# Patient Record
Sex: Female | Born: 1950 | Race: White | Hispanic: No | Marital: Single | Smoking: Never smoker
Health system: Southern US, Community
[De-identification: ages and names within clinical notes are randomized; demographics above are authoritative.]

## PROBLEM LIST (undated history)

## (undated) DIAGNOSIS — M1990 Unspecified osteoarthritis, unspecified site: Secondary | ICD-10-CM

## (undated) DIAGNOSIS — F32A Depression, unspecified: Secondary | ICD-10-CM

## (undated) DIAGNOSIS — K219 Gastro-esophageal reflux disease without esophagitis: Secondary | ICD-10-CM

## (undated) DIAGNOSIS — M51369 Other intervertebral disc degeneration, lumbar region without mention of lumbar back pain or lower extremity pain: Secondary | ICD-10-CM

## (undated) DIAGNOSIS — E039 Hypothyroidism, unspecified: Secondary | ICD-10-CM

## (undated) DIAGNOSIS — M5136 Other intervertebral disc degeneration, lumbar region: Secondary | ICD-10-CM

## (undated) DIAGNOSIS — D649 Anemia, unspecified: Secondary | ICD-10-CM

## (undated) DIAGNOSIS — F329 Major depressive disorder, single episode, unspecified: Secondary | ICD-10-CM

## (undated) DIAGNOSIS — I10 Essential (primary) hypertension: Secondary | ICD-10-CM

## (undated) DIAGNOSIS — M5126 Other intervertebral disc displacement, lumbar region: Secondary | ICD-10-CM

## (undated) DIAGNOSIS — F419 Anxiety disorder, unspecified: Secondary | ICD-10-CM

## (undated) HISTORY — PX: FRACTURE SURGERY: SHX138

## (undated) HISTORY — PX: DIAGNOSTIC LAPAROSCOPY: SUR761

## (undated) HISTORY — PX: OTHER SURGICAL HISTORY: SHX169

---

## 2010-09-02 ENCOUNTER — Other Ambulatory Visit: Payer: Self-pay | Admitting: Obstetrics & Gynecology

## 2010-09-02 DIAGNOSIS — Z1231 Encounter for screening mammogram for malignant neoplasm of breast: Secondary | ICD-10-CM

## 2010-10-19 ENCOUNTER — Ambulatory Visit
Admission: RE | Admit: 2010-10-19 | Discharge: 2010-10-19 | Disposition: A | Discharge status: Home / Self Care | Payer: BC Managed Care – PPO | Source: Ambulatory Visit | Attending: Obstetrics & Gynecology | Admitting: Obstetrics & Gynecology

## 2010-10-19 DIAGNOSIS — Z1231 Encounter for screening mammogram for malignant neoplasm of breast: Secondary | ICD-10-CM

## 2011-09-21 ENCOUNTER — Other Ambulatory Visit: Payer: Self-pay | Admitting: Obstetrics & Gynecology

## 2011-09-21 DIAGNOSIS — Z1231 Encounter for screening mammogram for malignant neoplasm of breast: Secondary | ICD-10-CM

## 2011-09-21 DIAGNOSIS — Z780 Asymptomatic menopausal state: Secondary | ICD-10-CM

## 2011-10-20 ENCOUNTER — Ambulatory Visit
Admission: RE | Admit: 2011-10-20 | Discharge: 2011-10-20 | Disposition: A | Discharge status: Home / Self Care | Payer: BC Managed Care – PPO | Source: Ambulatory Visit | Attending: Obstetrics & Gynecology | Admitting: Obstetrics & Gynecology

## 2011-10-20 DIAGNOSIS — Z780 Asymptomatic menopausal state: Secondary | ICD-10-CM

## 2011-10-20 DIAGNOSIS — Z1231 Encounter for screening mammogram for malignant neoplasm of breast: Secondary | ICD-10-CM

## 2011-11-11 ENCOUNTER — Other Ambulatory Visit: Payer: BC Managed Care – PPO

## 2011-12-06 ENCOUNTER — Ambulatory Visit
Admission: RE | Admit: 2011-12-06 | Discharge: 2011-12-06 | Disposition: A | Discharge status: Home / Self Care | Payer: BC Managed Care – PPO | Source: Ambulatory Visit | Attending: Obstetrics & Gynecology | Admitting: Obstetrics & Gynecology

## 2012-09-01 ENCOUNTER — Other Ambulatory Visit: Payer: Self-pay

## 2012-09-01 DIAGNOSIS — Z1231 Encounter for screening mammogram for malignant neoplasm of breast: Secondary | ICD-10-CM

## 2012-10-20 ENCOUNTER — Ambulatory Visit
Admission: RE | Admit: 2012-10-20 | Discharge: 2012-10-20 | Disposition: A | Discharge status: Home / Self Care | Payer: BC Managed Care – PPO | Source: Ambulatory Visit

## 2012-10-20 DIAGNOSIS — Z1231 Encounter for screening mammogram for malignant neoplasm of breast: Secondary | ICD-10-CM

## 2013-12-31 ENCOUNTER — Other Ambulatory Visit: Payer: Self-pay

## 2013-12-31 DIAGNOSIS — Z1231 Encounter for screening mammogram for malignant neoplasm of breast: Secondary | ICD-10-CM

## 2014-01-21 ENCOUNTER — Ambulatory Visit
Admission: RE | Admit: 2014-01-21 | Discharge: 2014-01-21 | Disposition: A | Discharge status: Home / Self Care | Payer: BLUE CROSS/BLUE SHIELD | Source: Ambulatory Visit

## 2014-01-21 DIAGNOSIS — Z1231 Encounter for screening mammogram for malignant neoplasm of breast: Secondary | ICD-10-CM

## 2014-12-25 ENCOUNTER — Other Ambulatory Visit: Payer: Self-pay

## 2014-12-25 DIAGNOSIS — Z1231 Encounter for screening mammogram for malignant neoplasm of breast: Secondary | ICD-10-CM

## 2015-01-24 ENCOUNTER — Ambulatory Visit
Admission: RE | Admit: 2015-01-24 | Discharge: 2015-01-24 | Disposition: A | Discharge status: Home / Self Care | Payer: BLUE CROSS/BLUE SHIELD | Source: Ambulatory Visit

## 2015-01-24 DIAGNOSIS — Z1231 Encounter for screening mammogram for malignant neoplasm of breast: Secondary | ICD-10-CM

## 2015-12-30 ENCOUNTER — Other Ambulatory Visit: Payer: Self-pay | Admitting: Obstetrics & Gynecology

## 2015-12-30 DIAGNOSIS — Z1231 Encounter for screening mammogram for malignant neoplasm of breast: Secondary | ICD-10-CM

## 2016-01-09 ENCOUNTER — Other Ambulatory Visit: Payer: Self-pay | Admitting: Internal Medicine

## 2016-01-09 DIAGNOSIS — Z780 Asymptomatic menopausal state: Secondary | ICD-10-CM

## 2016-02-03 ENCOUNTER — Ambulatory Visit: Payer: BLUE CROSS/BLUE SHIELD

## 2016-02-03 ENCOUNTER — Ambulatory Visit
Admission: RE | Admit: 2016-02-03 | Discharge: 2016-02-03 | Disposition: A | Discharge status: Home / Self Care | Payer: BLUE CROSS/BLUE SHIELD | Source: Ambulatory Visit | Attending: Obstetrics & Gynecology | Admitting: Obstetrics & Gynecology

## 2016-02-03 ENCOUNTER — Ambulatory Visit
Admission: RE | Admit: 2016-02-03 | Discharge: 2016-02-03 | Disposition: A | Discharge status: Home / Self Care | Payer: BLUE CROSS/BLUE SHIELD | Source: Ambulatory Visit | Attending: Internal Medicine | Admitting: Internal Medicine

## 2016-02-03 DIAGNOSIS — Z780 Asymptomatic menopausal state: Secondary | ICD-10-CM

## 2016-02-03 DIAGNOSIS — Z1231 Encounter for screening mammogram for malignant neoplasm of breast: Secondary | ICD-10-CM

## 2016-05-10 DIAGNOSIS — R7301 Impaired fasting glucose: Secondary | ICD-10-CM | POA: Diagnosis not present

## 2016-05-10 DIAGNOSIS — I10 Essential (primary) hypertension: Secondary | ICD-10-CM | POA: Diagnosis not present

## 2016-05-10 DIAGNOSIS — E039 Hypothyroidism, unspecified: Secondary | ICD-10-CM | POA: Diagnosis not present

## 2016-05-13 DIAGNOSIS — Z6838 Body mass index (BMI) 38.0-38.9, adult: Secondary | ICD-10-CM | POA: Diagnosis not present

## 2016-05-13 DIAGNOSIS — E039 Hypothyroidism, unspecified: Secondary | ICD-10-CM | POA: Diagnosis not present

## 2016-05-13 DIAGNOSIS — E6609 Other obesity due to excess calories: Secondary | ICD-10-CM | POA: Diagnosis not present

## 2016-05-13 DIAGNOSIS — Z0000 Encounter for general adult medical examination without abnormal findings: Secondary | ICD-10-CM | POA: Diagnosis not present

## 2016-05-13 DIAGNOSIS — R350 Frequency of micturition: Secondary | ICD-10-CM | POA: Diagnosis not present

## 2016-05-13 DIAGNOSIS — E782 Mixed hyperlipidemia: Secondary | ICD-10-CM | POA: Diagnosis not present

## 2016-05-13 DIAGNOSIS — I10 Essential (primary) hypertension: Secondary | ICD-10-CM | POA: Diagnosis not present

## 2016-05-13 DIAGNOSIS — M8580 Other specified disorders of bone density and structure, unspecified site: Secondary | ICD-10-CM | POA: Diagnosis not present

## 2016-05-13 DIAGNOSIS — R109 Unspecified abdominal pain: Secondary | ICD-10-CM | POA: Diagnosis not present

## 2016-05-13 DIAGNOSIS — R7301 Impaired fasting glucose: Secondary | ICD-10-CM | POA: Diagnosis not present

## 2016-05-28 DIAGNOSIS — H2513 Age-related nuclear cataract, bilateral: Secondary | ICD-10-CM | POA: Diagnosis not present

## 2016-05-28 DIAGNOSIS — H43811 Vitreous degeneration, right eye: Secondary | ICD-10-CM | POA: Diagnosis not present

## 2016-05-28 DIAGNOSIS — H43393 Other vitreous opacities, bilateral: Secondary | ICD-10-CM | POA: Diagnosis not present

## 2016-08-02 DIAGNOSIS — M9904 Segmental and somatic dysfunction of sacral region: Secondary | ICD-10-CM | POA: Diagnosis not present

## 2016-08-02 DIAGNOSIS — M5442 Lumbago with sciatica, left side: Secondary | ICD-10-CM | POA: Diagnosis not present

## 2016-08-02 DIAGNOSIS — M9903 Segmental and somatic dysfunction of lumbar region: Secondary | ICD-10-CM | POA: Diagnosis not present

## 2016-08-02 DIAGNOSIS — S336XXA Sprain of sacroiliac joint, initial encounter: Secondary | ICD-10-CM | POA: Diagnosis not present

## 2016-08-03 DIAGNOSIS — M9903 Segmental and somatic dysfunction of lumbar region: Secondary | ICD-10-CM | POA: Diagnosis not present

## 2016-08-03 DIAGNOSIS — S336XXA Sprain of sacroiliac joint, initial encounter: Secondary | ICD-10-CM | POA: Diagnosis not present

## 2016-08-03 DIAGNOSIS — M5442 Lumbago with sciatica, left side: Secondary | ICD-10-CM | POA: Diagnosis not present

## 2016-08-03 DIAGNOSIS — M9904 Segmental and somatic dysfunction of sacral region: Secondary | ICD-10-CM | POA: Diagnosis not present

## 2016-08-04 DIAGNOSIS — M9903 Segmental and somatic dysfunction of lumbar region: Secondary | ICD-10-CM | POA: Diagnosis not present

## 2016-08-04 DIAGNOSIS — M5442 Lumbago with sciatica, left side: Secondary | ICD-10-CM | POA: Diagnosis not present

## 2016-08-04 DIAGNOSIS — S336XXA Sprain of sacroiliac joint, initial encounter: Secondary | ICD-10-CM | POA: Diagnosis not present

## 2016-08-04 DIAGNOSIS — M9904 Segmental and somatic dysfunction of sacral region: Secondary | ICD-10-CM | POA: Diagnosis not present

## 2016-08-09 DIAGNOSIS — S336XXA Sprain of sacroiliac joint, initial encounter: Secondary | ICD-10-CM | POA: Diagnosis not present

## 2016-08-09 DIAGNOSIS — M9903 Segmental and somatic dysfunction of lumbar region: Secondary | ICD-10-CM | POA: Diagnosis not present

## 2016-08-09 DIAGNOSIS — M9904 Segmental and somatic dysfunction of sacral region: Secondary | ICD-10-CM | POA: Diagnosis not present

## 2016-08-09 DIAGNOSIS — M5442 Lumbago with sciatica, left side: Secondary | ICD-10-CM | POA: Diagnosis not present

## 2016-08-10 DIAGNOSIS — S336XXA Sprain of sacroiliac joint, initial encounter: Secondary | ICD-10-CM | POA: Diagnosis not present

## 2016-08-10 DIAGNOSIS — M9903 Segmental and somatic dysfunction of lumbar region: Secondary | ICD-10-CM | POA: Diagnosis not present

## 2016-08-10 DIAGNOSIS — M9904 Segmental and somatic dysfunction of sacral region: Secondary | ICD-10-CM | POA: Diagnosis not present

## 2016-08-10 DIAGNOSIS — M5442 Lumbago with sciatica, left side: Secondary | ICD-10-CM | POA: Diagnosis not present

## 2016-08-11 DIAGNOSIS — S336XXA Sprain of sacroiliac joint, initial encounter: Secondary | ICD-10-CM | POA: Diagnosis not present

## 2016-08-11 DIAGNOSIS — M9904 Segmental and somatic dysfunction of sacral region: Secondary | ICD-10-CM | POA: Diagnosis not present

## 2016-08-11 DIAGNOSIS — M9903 Segmental and somatic dysfunction of lumbar region: Secondary | ICD-10-CM | POA: Diagnosis not present

## 2016-08-11 DIAGNOSIS — M5442 Lumbago with sciatica, left side: Secondary | ICD-10-CM | POA: Diagnosis not present

## 2016-08-12 DIAGNOSIS — M9903 Segmental and somatic dysfunction of lumbar region: Secondary | ICD-10-CM | POA: Diagnosis not present

## 2016-08-12 DIAGNOSIS — M5442 Lumbago with sciatica, left side: Secondary | ICD-10-CM | POA: Diagnosis not present

## 2016-08-12 DIAGNOSIS — M9904 Segmental and somatic dysfunction of sacral region: Secondary | ICD-10-CM | POA: Diagnosis not present

## 2016-08-12 DIAGNOSIS — S336XXA Sprain of sacroiliac joint, initial encounter: Secondary | ICD-10-CM | POA: Diagnosis not present

## 2016-08-16 DIAGNOSIS — M5442 Lumbago with sciatica, left side: Secondary | ICD-10-CM | POA: Diagnosis not present

## 2016-08-16 DIAGNOSIS — S336XXA Sprain of sacroiliac joint, initial encounter: Secondary | ICD-10-CM | POA: Diagnosis not present

## 2016-08-16 DIAGNOSIS — M9904 Segmental and somatic dysfunction of sacral region: Secondary | ICD-10-CM | POA: Diagnosis not present

## 2016-08-16 DIAGNOSIS — M9903 Segmental and somatic dysfunction of lumbar region: Secondary | ICD-10-CM | POA: Diagnosis not present

## 2016-08-17 DIAGNOSIS — M5442 Lumbago with sciatica, left side: Secondary | ICD-10-CM | POA: Diagnosis not present

## 2016-08-17 DIAGNOSIS — S336XXA Sprain of sacroiliac joint, initial encounter: Secondary | ICD-10-CM | POA: Diagnosis not present

## 2016-08-17 DIAGNOSIS — M9903 Segmental and somatic dysfunction of lumbar region: Secondary | ICD-10-CM | POA: Diagnosis not present

## 2016-08-17 DIAGNOSIS — M9904 Segmental and somatic dysfunction of sacral region: Secondary | ICD-10-CM | POA: Diagnosis not present

## 2016-08-18 DIAGNOSIS — M5442 Lumbago with sciatica, left side: Secondary | ICD-10-CM | POA: Diagnosis not present

## 2016-08-18 DIAGNOSIS — S336XXA Sprain of sacroiliac joint, initial encounter: Secondary | ICD-10-CM | POA: Diagnosis not present

## 2016-08-18 DIAGNOSIS — M9903 Segmental and somatic dysfunction of lumbar region: Secondary | ICD-10-CM | POA: Diagnosis not present

## 2016-08-18 DIAGNOSIS — M9904 Segmental and somatic dysfunction of sacral region: Secondary | ICD-10-CM | POA: Diagnosis not present

## 2016-08-19 DIAGNOSIS — S336XXA Sprain of sacroiliac joint, initial encounter: Secondary | ICD-10-CM | POA: Diagnosis not present

## 2016-08-19 DIAGNOSIS — M5442 Lumbago with sciatica, left side: Secondary | ICD-10-CM | POA: Diagnosis not present

## 2016-08-19 DIAGNOSIS — M9904 Segmental and somatic dysfunction of sacral region: Secondary | ICD-10-CM | POA: Diagnosis not present

## 2016-08-19 DIAGNOSIS — M9903 Segmental and somatic dysfunction of lumbar region: Secondary | ICD-10-CM | POA: Diagnosis not present

## 2016-08-23 DIAGNOSIS — M9903 Segmental and somatic dysfunction of lumbar region: Secondary | ICD-10-CM | POA: Diagnosis not present

## 2016-08-23 DIAGNOSIS — M9904 Segmental and somatic dysfunction of sacral region: Secondary | ICD-10-CM | POA: Diagnosis not present

## 2016-08-23 DIAGNOSIS — M5442 Lumbago with sciatica, left side: Secondary | ICD-10-CM | POA: Diagnosis not present

## 2016-08-23 DIAGNOSIS — S336XXA Sprain of sacroiliac joint, initial encounter: Secondary | ICD-10-CM | POA: Diagnosis not present

## 2016-08-25 DIAGNOSIS — S336XXA Sprain of sacroiliac joint, initial encounter: Secondary | ICD-10-CM | POA: Diagnosis not present

## 2016-08-25 DIAGNOSIS — M9904 Segmental and somatic dysfunction of sacral region: Secondary | ICD-10-CM | POA: Diagnosis not present

## 2016-08-25 DIAGNOSIS — M9903 Segmental and somatic dysfunction of lumbar region: Secondary | ICD-10-CM | POA: Diagnosis not present

## 2016-08-25 DIAGNOSIS — M5442 Lumbago with sciatica, left side: Secondary | ICD-10-CM | POA: Diagnosis not present

## 2016-11-16 DIAGNOSIS — I10 Essential (primary) hypertension: Secondary | ICD-10-CM | POA: Diagnosis not present

## 2016-11-16 DIAGNOSIS — E039 Hypothyroidism, unspecified: Secondary | ICD-10-CM | POA: Diagnosis not present

## 2016-11-16 DIAGNOSIS — R7301 Impaired fasting glucose: Secondary | ICD-10-CM | POA: Diagnosis not present

## 2016-11-16 DIAGNOSIS — E782 Mixed hyperlipidemia: Secondary | ICD-10-CM | POA: Diagnosis not present

## 2016-11-18 DIAGNOSIS — I10 Essential (primary) hypertension: Secondary | ICD-10-CM | POA: Diagnosis not present

## 2016-11-18 DIAGNOSIS — R7301 Impaired fasting glucose: Secondary | ICD-10-CM | POA: Diagnosis not present

## 2016-11-18 DIAGNOSIS — E669 Obesity, unspecified: Secondary | ICD-10-CM | POA: Diagnosis not present

## 2016-11-18 DIAGNOSIS — M8580 Other specified disorders of bone density and structure, unspecified site: Secondary | ICD-10-CM | POA: Diagnosis not present

## 2016-11-18 DIAGNOSIS — Z23 Encounter for immunization: Secondary | ICD-10-CM | POA: Diagnosis not present

## 2016-11-18 DIAGNOSIS — M5430 Sciatica, unspecified side: Secondary | ICD-10-CM | POA: Diagnosis not present

## 2016-11-18 DIAGNOSIS — E039 Hypothyroidism, unspecified: Secondary | ICD-10-CM | POA: Diagnosis not present

## 2016-11-18 DIAGNOSIS — Z6831 Body mass index (BMI) 31.0-31.9, adult: Secondary | ICD-10-CM | POA: Diagnosis not present

## 2016-11-18 DIAGNOSIS — E782 Mixed hyperlipidemia: Secondary | ICD-10-CM | POA: Diagnosis not present

## 2016-11-23 DIAGNOSIS — Z23 Encounter for immunization: Secondary | ICD-10-CM | POA: Diagnosis not present

## 2016-11-29 ENCOUNTER — Encounter (INDEPENDENT_AMBULATORY_CARE_PROVIDER_SITE_OTHER): Payer: Self-pay | Admitting: Orthopaedic Surgery

## 2016-11-29 ENCOUNTER — Ambulatory Visit (INDEPENDENT_AMBULATORY_CARE_PROVIDER_SITE_OTHER): Payer: Medicare Other

## 2016-11-29 ENCOUNTER — Telehealth (INDEPENDENT_AMBULATORY_CARE_PROVIDER_SITE_OTHER): Payer: Self-pay | Admitting: Orthopedic Surgery

## 2016-11-29 ENCOUNTER — Other Ambulatory Visit (INDEPENDENT_AMBULATORY_CARE_PROVIDER_SITE_OTHER): Payer: Self-pay | Admitting: Orthopaedic Surgery

## 2016-11-29 ENCOUNTER — Ambulatory Visit (INDEPENDENT_AMBULATORY_CARE_PROVIDER_SITE_OTHER): Payer: Medicare Other | Admitting: Orthopaedic Surgery

## 2016-11-29 DIAGNOSIS — M4808 Spinal stenosis, sacral and sacrococcygeal region: Secondary | ICD-10-CM

## 2016-11-29 DIAGNOSIS — M545 Low back pain: Secondary | ICD-10-CM | POA: Diagnosis not present

## 2016-11-29 DIAGNOSIS — G8929 Other chronic pain: Secondary | ICD-10-CM

## 2016-11-29 MED ORDER — TIZANIDINE HCL 4 MG PO TABS: 4.0000 mg | ORAL_TABLET | Freq: Four times a day (QID) | ORAL | 2 refills | Status: DC | PRN

## 2016-11-29 MED ORDER — NAPROXEN 500 MG PO TABS: 500.0000 mg | ORAL_TABLET | Freq: Two times a day (BID) | ORAL | 3 refills | Status: DC

## 2016-11-29 MED ORDER — PREDNISONE 10 MG (21) PO TBPK
ORAL_TABLET | ORAL | 0 refills | Status: DC
Start: 2016-11-29 — End: 2017-09-14

## 2016-11-29 NOTE — Telephone Encounter (Signed)
error 

## 2016-11-29 NOTE — Progress Notes (Signed)
Office Visit Note   Patient: Kelli Mason           Date of Birth: 12-Sep-1950           MRN: [id number] Visit Date: 11/29/2016              Requested by: No referring provider defined for this encounter. PCP: System, Pcp Not In   Assessment & Plan: Visit Diagnoses:  1. Chronic bilateral low back pain, with sciatica presence unspecified   2. Spinal stenosis, sacral and sacrococcygeal region     Plan: Impression is severe lumbar degenerative disc disease and spondylolisthesis resulting in spinal stenosis and neurogenic claudication.  Prescription for Zanaflex, naproxen, prednisone.  MRI to evaluate for degree of spinal stenosis which may need surgical treatment.  Follow-Up Instructions: Return in about 2 weeks (around 12/13/2016).   Orders:  Orders Placed This Encounter  Procedures  . XR Lumbar Spine 2-3 Views  . MR Lumbar Spine w/o contrast   Meds ordered this encounter  Medications  . tiZANidine (ZANAFLEX) 4 MG tablet    Sig: Take 1 tablet (4 mg total) by mouth every 6 (six) hours as needed for muscle spasms.    Dispense:  30 tablet    Refill:  2  . naproxen (NAPROSYN) 500 MG tablet    Sig: Take 1 tablet (500 mg total) by mouth 2 (two) times daily with a meal.    Dispense:  30 tablet    Refill:  3  . predniSONE (STERAPRED UNI-PAK 21 TAB) 10 MG (21) TBPK tablet    Sig: Take as directed    Dispense:  21 tablet    Refill:  0      Procedures: No procedures performed   Clinical Data: No additional findings.   Subjective: Chief Complaint  Patient presents with  . Lower Back - Pain    Patient is a 66 year old female who comes in with increasing low back pain since July that radiates into both of her legs with numbness and tingling and neurogenic claudication.  She has seen a chiropractor for several months and this has failed to provide her with any relief.  Denies any bowel or bladder dysfunction    Review of Systems  Constitutional: Negative.   HENT:  Negative.   Eyes: Negative.   Respiratory: Negative.   Cardiovascular: Negative.   Endocrine: Negative.   Musculoskeletal: Negative.   Neurological: Negative.   Hematological: Negative.   Psychiatric/Behavioral: Negative.   All other systems reviewed and are negative.    Objective: Vital Signs: There were no vitals taken for this visit.  Physical Exam  Constitutional: She is oriented to person, place, and time. She appears well-developed and well-nourished.  HENT:  Head: Normocephalic and atraumatic.  Eyes: EOM are normal.  Neck: Neck supple.  Pulmonary/Chest: Effort normal.  Abdominal: Soft.  Neurological: She is alert and oriented to person, place, and time.  Skin: Skin is warm. Capillary refill takes less than 2 seconds.  Psychiatric: She has a normal mood and affect. Her behavior is normal. Judgment and thought content normal.  Nursing note and vitals reviewed.   Ortho Exam Bilateral lower extremity exam shows normal motor and sensory and reflex exam.  No pathologic reflexes. Specialty Comments:  No specialty comments available.  Imaging: Xr Lumbar Spine 2-3 Views  Result Date: 11/29/2016 Severe degenerative disc disease and lumbar spondylosis with degenerative spondylolisthesis    PMFS History: There are no active problems to display for this patient.  History reviewed.  No pertinent past medical history.  History reviewed. No pertinent family history.  History reviewed. No pertinent surgical history. Social History   Occupational History  . Not on file  Tobacco Use  . Smoking status: Never Smoker  . Smokeless tobacco: Never Used  Substance and Sexual Activity  . Alcohol use: Not on file  . Drug use: Not on file  . Sexual activity: Not on file

## 2016-11-30 NOTE — Telephone Encounter (Signed)
yes

## 2016-12-14 ENCOUNTER — Ambulatory Visit (INDEPENDENT_AMBULATORY_CARE_PROVIDER_SITE_OTHER): Payer: Medicare Other | Admitting: Orthopaedic Surgery

## 2016-12-18 ENCOUNTER — Ambulatory Visit
Admission: RE | Admit: 2016-12-18 | Discharge: 2016-12-18 | Disposition: A | Discharge status: Home / Self Care | Payer: Medicare Other | Source: Ambulatory Visit | Attending: Orthopaedic Surgery | Admitting: Orthopaedic Surgery

## 2016-12-18 DIAGNOSIS — M48061 Spinal stenosis, lumbar region without neurogenic claudication: Secondary | ICD-10-CM | POA: Diagnosis not present

## 2016-12-18 DIAGNOSIS — M4808 Spinal stenosis, sacral and sacrococcygeal region: Secondary | ICD-10-CM

## 2016-12-21 ENCOUNTER — Encounter (INDEPENDENT_AMBULATORY_CARE_PROVIDER_SITE_OTHER): Payer: Self-pay | Admitting: Orthopaedic Surgery

## 2016-12-21 ENCOUNTER — Ambulatory Visit (INDEPENDENT_AMBULATORY_CARE_PROVIDER_SITE_OTHER): Payer: Medicare Other | Admitting: Orthopaedic Surgery

## 2016-12-21 DIAGNOSIS — M545 Low back pain: Secondary | ICD-10-CM | POA: Diagnosis not present

## 2016-12-21 DIAGNOSIS — G8929 Other chronic pain: Secondary | ICD-10-CM | POA: Diagnosis not present

## 2016-12-21 DIAGNOSIS — M4808 Spinal stenosis, sacral and sacrococcygeal region: Secondary | ICD-10-CM

## 2016-12-21 NOTE — Progress Notes (Signed)
   Office Visit Note   Patient: Kelli Mason           Date of Birth: 1950-09-10           MRN: [id number] Visit Date: 12/21/2016              Requested by: No referring provider defined for this encounter. PCP: Celene Squibb, MD   Assessment & Plan: Visit Diagnoses:  1. Chronic bilateral low back pain, with sciatica presence unspecified   2. Spinal stenosis, sacral and sacrococcygeal region     Plan: MRI shows multilevel degenerative disc disease with a large L4-L5 disc extrusion with resultant stenosis.  Referral to Dr. Kathyrn Sheriff for further evaluation and treatment.  Follow-Up Instructions: Return if symptoms worsen or fail to improve.   Orders:  Orders Placed This Encounter  Procedures  . Ambulatory referral to Neurosurgery   No orders of the defined types were placed in this encounter.     Procedures: No procedures performed   Clinical Data: No additional findings.   Subjective: Chief Complaint  Patient presents with  . Lower Back - Pain, Follow-up    Patient follows up today for her MRI of her lumbar spine.  She is relatively unchanged    Review of Systems   Objective: Vital Signs: There were no vitals taken for this visit.  Physical Exam  Ortho Exam Stable exam Specialty Comments:  No specialty comments available.  Imaging: No results found.   PMFS History: There are no active problems to display for this patient.  History reviewed. No pertinent past medical history.  History reviewed. No pertinent family history.  History reviewed. No pertinent surgical history. Social History   Occupational History  . Not on file  Tobacco Use  . Smoking status: Never Smoker  . Smokeless tobacco: Never Used  Substance and Sexual Activity  . Alcohol use: Not on file  . Drug use: Not on file  . Sexual activity: Not on file

## 2016-12-30 ENCOUNTER — Other Ambulatory Visit: Payer: Self-pay | Admitting: Obstetrics & Gynecology

## 2016-12-30 DIAGNOSIS — Z139 Encounter for screening, unspecified: Secondary | ICD-10-CM

## 2017-01-19 DIAGNOSIS — M47816 Spondylosis without myelopathy or radiculopathy, lumbar region: Secondary | ICD-10-CM | POA: Diagnosis not present

## 2017-01-19 DIAGNOSIS — M4316 Spondylolisthesis, lumbar region: Secondary | ICD-10-CM | POA: Diagnosis not present

## 2017-01-19 DIAGNOSIS — M5126 Other intervertebral disc displacement, lumbar region: Secondary | ICD-10-CM | POA: Diagnosis not present

## 2017-01-19 DIAGNOSIS — M48062 Spinal stenosis, lumbar region with neurogenic claudication: Secondary | ICD-10-CM | POA: Diagnosis not present

## 2017-01-19 DIAGNOSIS — M549 Dorsalgia, unspecified: Secondary | ICD-10-CM | POA: Diagnosis not present

## 2017-01-19 DIAGNOSIS — M5136 Other intervertebral disc degeneration, lumbar region: Secondary | ICD-10-CM | POA: Diagnosis not present

## 2017-01-19 DIAGNOSIS — M546 Pain in thoracic spine: Secondary | ICD-10-CM | POA: Diagnosis not present

## 2017-01-25 ENCOUNTER — Other Ambulatory Visit (INDEPENDENT_AMBULATORY_CARE_PROVIDER_SITE_OTHER): Payer: Self-pay | Admitting: Orthopaedic Surgery

## 2017-02-08 ENCOUNTER — Ambulatory Visit
Admission: RE | Admit: 2017-02-08 | Discharge: 2017-02-08 | Disposition: A | Discharge status: Home / Self Care | Payer: Medicare Other | Source: Ambulatory Visit | Attending: Obstetrics & Gynecology | Admitting: Obstetrics & Gynecology

## 2017-02-08 DIAGNOSIS — Z139 Encounter for screening, unspecified: Secondary | ICD-10-CM

## 2017-02-08 DIAGNOSIS — Z1231 Encounter for screening mammogram for malignant neoplasm of breast: Secondary | ICD-10-CM | POA: Diagnosis not present

## 2017-02-14 DIAGNOSIS — Z01419 Encounter for gynecological examination (general) (routine) without abnormal findings: Secondary | ICD-10-CM | POA: Diagnosis not present

## 2017-02-14 DIAGNOSIS — Z124 Encounter for screening for malignant neoplasm of cervix: Secondary | ICD-10-CM | POA: Diagnosis not present

## 2017-05-16 DIAGNOSIS — E782 Mixed hyperlipidemia: Secondary | ICD-10-CM | POA: Diagnosis not present

## 2017-05-16 DIAGNOSIS — F341 Dysthymic disorder: Secondary | ICD-10-CM | POA: Diagnosis not present

## 2017-05-16 DIAGNOSIS — R7301 Impaired fasting glucose: Secondary | ICD-10-CM | POA: Diagnosis not present

## 2017-05-16 DIAGNOSIS — E039 Hypothyroidism, unspecified: Secondary | ICD-10-CM | POA: Diagnosis not present

## 2017-05-18 DIAGNOSIS — R7301 Impaired fasting glucose: Secondary | ICD-10-CM | POA: Diagnosis not present

## 2017-05-18 DIAGNOSIS — I10 Essential (primary) hypertension: Secondary | ICD-10-CM | POA: Diagnosis not present

## 2017-05-18 DIAGNOSIS — E782 Mixed hyperlipidemia: Secondary | ICD-10-CM | POA: Diagnosis not present

## 2017-05-18 DIAGNOSIS — M1612 Unilateral primary osteoarthritis, left hip: Secondary | ICD-10-CM | POA: Diagnosis not present

## 2017-05-18 DIAGNOSIS — M8580 Other specified disorders of bone density and structure, unspecified site: Secondary | ICD-10-CM | POA: Diagnosis not present

## 2017-05-18 DIAGNOSIS — Z6837 Body mass index (BMI) 37.0-37.9, adult: Secondary | ICD-10-CM | POA: Diagnosis not present

## 2017-05-18 DIAGNOSIS — E6609 Other obesity due to excess calories: Secondary | ICD-10-CM | POA: Diagnosis not present

## 2017-05-18 DIAGNOSIS — Z0000 Encounter for general adult medical examination without abnormal findings: Secondary | ICD-10-CM | POA: Diagnosis not present

## 2017-05-18 DIAGNOSIS — E039 Hypothyroidism, unspecified: Secondary | ICD-10-CM | POA: Diagnosis not present

## 2017-05-18 DIAGNOSIS — M5430 Sciatica, unspecified side: Secondary | ICD-10-CM | POA: Diagnosis not present

## 2017-05-24 DIAGNOSIS — M47816 Spondylosis without myelopathy or radiculopathy, lumbar region: Secondary | ICD-10-CM | POA: Diagnosis not present

## 2017-05-24 DIAGNOSIS — I10 Essential (primary) hypertension: Secondary | ICD-10-CM | POA: Diagnosis not present

## 2017-05-24 DIAGNOSIS — M5136 Other intervertebral disc degeneration, lumbar region: Secondary | ICD-10-CM | POA: Diagnosis not present

## 2017-05-24 DIAGNOSIS — Z6838 Body mass index (BMI) 38.0-38.9, adult: Secondary | ICD-10-CM | POA: Diagnosis not present

## 2017-05-24 DIAGNOSIS — M4316 Spondylolisthesis, lumbar region: Secondary | ICD-10-CM | POA: Diagnosis not present

## 2017-05-24 DIAGNOSIS — M48062 Spinal stenosis, lumbar region with neurogenic claudication: Secondary | ICD-10-CM | POA: Diagnosis not present

## 2017-05-24 DIAGNOSIS — M5126 Other intervertebral disc displacement, lumbar region: Secondary | ICD-10-CM | POA: Diagnosis not present

## 2017-07-27 ENCOUNTER — Ambulatory Visit (INDEPENDENT_AMBULATORY_CARE_PROVIDER_SITE_OTHER): Payer: Medicare Other

## 2017-07-27 ENCOUNTER — Ambulatory Visit (INDEPENDENT_AMBULATORY_CARE_PROVIDER_SITE_OTHER): Payer: Medicare Other | Admitting: Orthopaedic Surgery

## 2017-07-27 ENCOUNTER — Encounter (INDEPENDENT_AMBULATORY_CARE_PROVIDER_SITE_OTHER): Payer: Self-pay | Admitting: Orthopaedic Surgery

## 2017-07-27 DIAGNOSIS — M1651 Unilateral post-traumatic osteoarthritis, right hip: Secondary | ICD-10-CM

## 2017-07-27 NOTE — Progress Notes (Signed)
Office Visit Note   Patient: Kelli Mason           Date of Birth: 1950/10/17           MRN: [id number] Visit Date: 07/27/2017              Requested by: Celene Squibb, MD [address] [place], [place] [address] PCP: Celene Squibb, MD   Assessment & Plan: Visit Diagnoses:  1. 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  X-ray findings were reviewed with the patient and due to the fact that the hardware is now protruding out of the femoral head she is unlikely to get any significant relief from conservative treatment.  At this point we agreed to proceed with hardware removal and conversion to a total hip replacement.  We discussed the risks and benefits and postoperative rehab and recovery.  She understands and wishes to proceed.  We will schedule her in the near future per her convenience.  Follow-Up Instructions: Return if symptoms worsen or fail to improve.   Orders:  Orders Placed This Encounter  Procedures  . XR HIP UNILAT W OR W/O PELVIS 2-3 VIEWS RIGHT   No orders of the defined types were placed in this encounter.     Procedures: No procedures performed   Clinical Data: No additional findings.   Subjective: Chief Complaint  Patient presents with  . Right Hip - Pain    Kelli Mason is a very pleasant 67 year old female comes in with chronic right knee pain.  The pain is chronic and causes her significant difficulty with ADLs and normal activity.  Tylenol gives temporary relief.  Denies any numbness and tingling.  She previously underwent fixation of a right hip fracture about 11 years ago in [place] in which this was treated with a sliding hip screw and a superior partially-threaded screw.   Review of Systems  Constitutional: Negative.   HENT: Negative.   Eyes: Negative.   Respiratory: Negative.   Cardiovascular: Negative.   Endocrine: Negative.   Musculoskeletal:  Negative.   Neurological: Negative.   Hematological: Negative.   Psychiatric/Behavioral: Negative.   All other systems reviewed and are negative.    Objective: Vital Signs: There were no vitals taken for this visit.  Physical Exam  Constitutional: She is oriented to person, place, and time. She appears well-developed and well-nourished.  HENT:  Head: Normocephalic and atraumatic.  Eyes: EOM are normal.  Neck: Neck supple.  Pulmonary/Chest: Effort normal.  Abdominal: Soft.  Neurological: She is alert and oriented to person, place, and time.  Skin: Skin is warm. Capillary refill takes less than 2 seconds.  Psychiatric: She has a normal mood and affect. Her behavior is normal. Judgment and thought content normal.  Nursing note and vitals reviewed.   Ortho Exam Right hip exam shows very limited internal and external rotation.  Positive FADIR.  Negative sciatic tension signs. Specialty Comments:  No specialty comments available.  Imaging: Xr Hip Unilat W Or W/o Pelvis 2-3 Views Right  Result Date: 07/27/2017 Advanced degenerative joint disease right hip with joint space collapse and femoral head collapse associated with avascular necrosis.  Sliding hip screw and partially-threaded screw present with protrusion out of the femoral head.    PMFS History: There are no active problems to display for this patient.  History reviewed. No pertinent past medical history.  History reviewed. No pertinent family history.  History reviewed. No pertinent surgical history. Social History   Occupational History  . Not on file  Tobacco Use  . Smoking status: Never Smoker  . Smokeless tobacco: Never Used  Substance and Sexual Activity  . Alcohol use: Not on file  . Drug use: Not on file  . Sexual activity: Not on file

## 2017-09-14 NOTE — Pre-Procedure Instructions (Signed)
Kelli Mason  09/14/2017      [id number] - [place], [address] [place] [address] Phone: [phone] Fax: [phone]    Your procedure is scheduled on  Monday 09/26/17  Report to [facility] Admitting at 1100 A.M.  Call this number if you have problems the morning of surgery:  [phone]   Remember:  Do not eat or drink after midnight.     Take these medicines the morning of surgery with A SIP OF WATER     Do not wear jewelry, make-up or nail polish.  Do not wear lotions, powders, or perfumes, or deodorant.  Do not shave 48 hours prior to surgery.  Men may shave face and neck.  Do not bring valuables to the hospital.  [facility] is not responsible for any belongings or valuables.  Contacts, dentures or bridgework may not be worn into surgery.  Leave your suitcase in the car.  After surgery it may be brought to your room.  For patients admitted to the hospital, discharge time will be determined by your treatment team.  Patients discharged the day of surgery will not be allowed to drive home.   Name and phone number of your driver:    Special instructions:  [place] - Preparing for Surgery  Before surgery, you can play an important role.  Because skin is not sterile, your skin needs to be as free of germs as possible.  You can reduce the number of germs on you skin by washing with CHG (chlorahexidine gluconate) soap before surgery.  CHG is an antiseptic cleaner which kills germs and bonds with the skin to continue killing germs even after washing.  Oral Hygiene is also important in reducing the risk of infection.  Remember to brush your teeth with your regular toothpaste the morning of surgery.  Please DO NOT use if you have an allergy to CHG or antibacterial soaps.  If your skin becomes reddened/irritated stop using the CHG and inform your nurse when you arrive at  Short Stay.  Do not shave (including legs and underarms) for at least 48 hours prior to the first CHG shower.  You may shave your face.  Please follow these instructions carefully:   1.  Shower with CHG Soap the night before surgery and the morning of Surgery.  2.  If you choose to wash your hair, wash your hair first as usual with your normal shampoo.  3.  After you shampoo, rinse your hair and body thoroughly to remove the shampoo. 4.  Use CHG as you would any other liquid soap.  You can apply chg directly to the skin and wash gently with a      scrungie or washcloth.           5.  Apply the CHG Soap to your body ONLY FROM THE NECK DOWN.   Do not use on open wounds or open sores. Avoid contact with your eyes, ears, mouth and genitals (private parts).  Wash genitals (private parts) with your normal soap.  6.  Wash thoroughly, paying special attention to the area where your surgery will be performed.  7.  Thoroughly rinse your body with warm water from the neck down.  8.  DO NOT shower/wash with your normal soap after using and rinsing off the CHG Soap.  9.  Pat yourself dry  with a clean towel.            10.  Wear clean pajamas.            11.  Place clean sheets on your bed the night of your first shower and do not sleep with pets.  Day of Surgery  Do not apply any lotions/deoderants the morning of surgery.   Please wear clean clothes to the hospital/surgery center. Remember to brush your teeth with toothpaste.     Please read over the following fact sheets that you were given. Pain Booklet, MRSA Information and Surgical Site Infection Prevention

## 2017-09-15 ENCOUNTER — Ambulatory Visit (HOSPITAL_COMMUNITY)
Admission: RE | Admit: 2017-09-15 | Discharge: 2017-09-15 | Disposition: A | Discharge status: Home / Self Care | Payer: Medicare Other | Source: Ambulatory Visit | Attending: Physician Assistant | Admitting: Physician Assistant

## 2017-09-15 ENCOUNTER — Telehealth (INDEPENDENT_AMBULATORY_CARE_PROVIDER_SITE_OTHER): Payer: Self-pay | Admitting: Orthopaedic Surgery

## 2017-09-15 ENCOUNTER — Other Ambulatory Visit: Payer: Self-pay

## 2017-09-15 ENCOUNTER — Encounter (HOSPITAL_COMMUNITY)
Admission: RE | Admit: 2017-09-15 | Discharge: 2017-09-15 | Disposition: A | Discharge status: Home / Self Care | Payer: Medicare Other | Source: Ambulatory Visit | Attending: Orthopaedic Surgery | Admitting: Orthopaedic Surgery

## 2017-09-15 ENCOUNTER — Encounter (HOSPITAL_COMMUNITY): Payer: Self-pay

## 2017-09-15 DIAGNOSIS — M1611 Unilateral primary osteoarthritis, right hip: Secondary | ICD-10-CM | POA: Insufficient documentation

## 2017-09-15 DIAGNOSIS — Z01818 Encounter for other preprocedural examination: Secondary | ICD-10-CM | POA: Diagnosis not present

## 2017-09-15 HISTORY — DX: Unspecified osteoarthritis, unspecified site: M19.90

## 2017-09-15 HISTORY — DX: Hypothyroidism, unspecified: E03.9

## 2017-09-15 HISTORY — DX: Essential (primary) hypertension: I10

## 2017-09-15 HISTORY — DX: Depression, unspecified: F32.A

## 2017-09-15 HISTORY — DX: Anxiety disorder, unspecified: F41.9

## 2017-09-15 HISTORY — DX: Other intervertebral disc displacement, lumbar region: M51.26

## 2017-09-15 HISTORY — DX: Major depressive disorder, single episode, unspecified: F32.9

## 2017-09-15 HISTORY — DX: Other intervertebral disc degeneration, lumbar region without mention of lumbar back pain or lower extremity pain: M51.369

## 2017-09-15 HISTORY — DX: Gastro-esophageal reflux disease without esophagitis: K21.9

## 2017-09-15 HISTORY — DX: Other intervertebral disc degeneration, lumbar region: M51.36

## 2017-09-15 HISTORY — DX: Anemia, unspecified: D64.9

## 2017-09-15 LAB — SURGICAL PCR SCREEN
MRSA, PCR: NEGATIVE
Staphylococcus aureus: NEGATIVE

## 2017-09-15 LAB — COMPREHENSIVE METABOLIC PANEL
ALK PHOS: 72 U/L (ref 38–126)
ALT: 25 U/L (ref 0–44)
ANION GAP: 12 (ref 5–15)
AST: 30 U/L (ref 15–41)
Albumin: 3.8 g/dL (ref 3.5–5.0)
BILIRUBIN TOTAL: 0.6 mg/dL (ref 0.3–1.2)
BUN: 14 mg/dL (ref 8–23)
CALCIUM: 9.7 mg/dL (ref 8.9–10.3)
CO2: 26 mmol/L (ref 22–32)
Chloride: 106 mmol/L (ref 98–111)
Creatinine, Ser: 1.04 mg/dL — ABNORMAL HIGH (ref 0.44–1.00)
GFR calc non Af Amer: 55 mL/min — ABNORMAL LOW (ref >60)
Glucose, Bld: 119 mg/dL — ABNORMAL HIGH (ref 70–99)
Potassium: 4.1 mmol/L (ref 3.5–5.1)
Sodium: 144 mmol/L (ref 135–145)
Total Protein: 6.7 g/dL (ref 6.5–8.1)

## 2017-09-15 LAB — CBC WITH DIFFERENTIAL/PLATELET
Abs Immature Granulocytes: 0 10*3/uL (ref 0.0–0.1)
Basophils Absolute: 0 10*3/uL (ref 0.0–0.1)
Basophils Relative: 1 %
EOS PCT: 1 %
Eosinophils Absolute: 0.1 10*3/uL (ref 0.0–0.7)
HEMATOCRIT: 44.8 % (ref 36.0–46.0)
Hemoglobin: 14.1 g/dL (ref 12.0–15.0)
Immature Granulocytes: 0 %
LYMPHS ABS: 1.1 10*3/uL (ref 0.7–4.0)
LYMPHS PCT: 21 %
MCH: 32.9 pg (ref 26.0–34.0)
MCHC: 31.5 g/dL (ref 30.0–36.0)
MCV: 104.7 fL — ABNORMAL HIGH (ref 78.0–100.0)
MONO ABS: 0.5 10*3/uL (ref 0.1–1.0)
Monocytes Relative: 9 %
Neutro Abs: 3.8 10*3/uL (ref 1.7–7.7)
Neutrophils Relative %: 68 %
Platelets: 223 10*3/uL (ref 150–400)
RBC: 4.28 MIL/uL (ref 3.87–5.11)
RDW: 13.6 % (ref 11.5–15.5)
WBC: 5.5 10*3/uL (ref 4.0–10.5)

## 2017-09-15 LAB — PROTIME-INR
INR: 1.04
PROTHROMBIN TIME: 13.5 s (ref 11.4–15.2)

## 2017-09-15 LAB — APTT: aPTT: 29 seconds (ref 24–36)

## 2017-09-15 LAB — ABO/RH: ABO/RH(D): O POS

## 2017-09-15 MED ORDER — CLINDAMYCIN PHOSPHATE 900 MG/50ML IV SOLN: 900.0000 mg | Freq: Once | INTRAVENOUS | Status: DC

## 2017-09-15 NOTE — Telephone Encounter (Signed)
See message.

## 2017-09-15 NOTE — Telephone Encounter (Signed)
That's ok.  We will give it slowly to see if she gets reaction

## 2017-09-15 NOTE — Telephone Encounter (Signed)
Debbie from Pre-Admit called stating that the antibiotic that is order for this patient prior to surgery gives this patient hives.  Patient stated that it has been awhile since on this medication.  If you need to call Jackelyn Poling, her number is [phone].  Thank you.

## 2017-09-15 NOTE — Progress Notes (Signed)
   09/15/17 1005  OBSTRUCTIVE SLEEP APNEA  Have you ever been diagnosed with sleep apnea through a sleep study? No  Do you snore loudly (loud enough to be heard through closed doors)?  0  Do you often feel tired, fatigued, or sleepy during the daytime (such as falling asleep during driving or talking to someone)? 0  Has anyone observed you stop breathing during your sleep? 0  Do you have, or are you being treated for high blood pressure? 1  BMI more than 35 kg/m2? 1  Age > 50 (1-yes) 1  Neck circumference greater than:Female 16 inches or larger, Female 17inches or larger? 1  Female Gender (Yes=1) 0  Obstructive Sleep Apnea Score 4  Score 5 or greater  Results sent to PCP

## 2017-09-15 NOTE — Pre-Procedure Instructions (Signed)
Kelli Mason [facility]  09/15/2017      [id number] - [place], [place] [place] [address] Phone: [phone] Fax: [phone]    Your procedure is scheduled on  Monday 09/26/17   Report to [facility] Admitting at 1100 A.M.             (posted surgery time 1:00p - 3:34p)   Call this number if you have problems the morning of surgery:  [phone]   Remember:   Do not eat any foods or drink any liquids after midnight, Sunday.    Take these medicines the morning of surgery with A SIP OF WATER : Levothyroxine    Do not wear jewelry, make-up or nail polish.  Do not wear lotions, powders, or perfumes, or deodorant.  Do not shave 48 hours prior to surgery.  Do not bring valuables to the hospital.  [facility] is not responsible for any belongings or valuables.  Contacts, dentures or bridgework may not be worn into surgery.  Leave your suitcase in the car.  After surgery it may be brought to your room.  For patients admitted to the hospital, discharge time will be determined by your treatment team.    Special instructions:  [place] - Preparing for Surgery  Before surgery, you can play an important role.  Because skin is not sterile, your skin needs to be as free of germs as possible.  You can reduce the number of germs on you skin by washing with CHG (chlorahexidine gluconate) soap before surgery.  CHG is an antiseptic cleaner which kills germs and bonds with the skin to continue killing germs even after washing.     Oral Hygiene is also important in reducing the risk of infection.    Remember to brush your teeth with your regular toothpaste the morning of surgery.  Please DO NOT use if you have an allergy to CHG or antibacterial soaps.  If your skin becomes reddened/irritated stop using the CHG and inform your nurse when you arrive at Short Stay.  Do not shave (including  legs and underarms) for at least 48 hours prior to the first CHG shower.  You may shave your face.  Please follow these instructions carefully:   1.  Shower with CHG Soap the night before surgery and the morning of Surgery.   2.  If you choose to wash your hair, wash your hair first as usual with your normal shampoo.   3.  After you shampoo, rinse your hair and body thoroughly to remove the shampoo.  4.  Use CHG as you would any other liquid soap.  You can apply chg directly to the skin and wash gently with a      scrungie or washcloth.            5.  Apply the CHG Soap to your body ONLY FROM THE NECK DOWN.   Do not use on open wounds or open sores. Avoid contact with your eyes, ears, mouth and genitals (private parts).  Wash genitals (private parts) with your normal soap.   6.  Wash thoroughly, paying special attention to the area where your surgery will be performed.   7.  Thoroughly rinse your body with warm water from the neck down.   8.  DO NOT shower/wash with your normal soap after using and rinsing off the  CHG Soap.   9.  Pat yourself dry with a clean towel.             10.  Wear clean pajamas.             11.  Place clean sheets on your bed the night of your first shower and do not sleep with pets.  Day of Surgery  Do not apply any lotions/deoderants the morning of surgery.   Please wear clean clothes to the hospital/surgery center. Remember to brush your teeth with toothpaste.  Please read over the following fact sheets that you were given. Pain Booklet, MRSA Information and Surgical Site Infection Prevention

## 2017-09-15 NOTE — Progress Notes (Signed)
PCP is Dr. Delphina Cahill  LOV 05/2017 Denies murmur, cp, sob.  No Cardiac issues or testing done. She did say, that 30 yrs ago, when she got keflex, it caused hives and rash. I called and spoke with Katharine Look at the surgeon's office relaying message (to be given to [place]) ? Change pre antibiotic

## 2017-09-15 NOTE — Telephone Encounter (Signed)
Called Debbie to let her know.

## 2017-09-16 ENCOUNTER — Other Ambulatory Visit (HOSPITAL_COMMUNITY): Payer: Medicare Other

## 2017-09-21 ENCOUNTER — Inpatient Hospital Stay (HOSPITAL_COMMUNITY): Admission: RE | Admit: 2017-09-21 | Payer: Medicare Other | Source: Ambulatory Visit

## 2017-09-23 MED ORDER — LACTATED RINGERS IV SOLN
INTRAVENOUS | Status: DC
  Administered 2017-09-26 (×3): via INTRAVENOUS

## 2017-09-23 MED ORDER — TRANEXAMIC ACID 1000 MG/10ML IV SOLN
1000.0000 mg | INTRAVENOUS | Status: AC
  Administered 2017-09-26: 1000 mg via INTRAVENOUS
  Filled 2017-09-23: qty 1000

## 2017-09-23 MED ORDER — TRANEXAMIC ACID 1000 MG/10ML IV SOLN
2000.0000 mg | INTRAVENOUS | Status: AC
  Administered 2017-09-26: 2000 mg via TOPICAL
  Filled 2017-09-23: qty 20

## 2017-09-26 ENCOUNTER — Inpatient Hospital Stay (HOSPITAL_COMMUNITY): Payer: Medicare Other

## 2017-09-26 ENCOUNTER — Encounter (HOSPITAL_COMMUNITY)
Admission: RE | Disposition: A | Discharge status: Skilled Nursing Facility | Payer: Self-pay | Source: Home / Self Care | Attending: Orthopaedic Surgery

## 2017-09-26 ENCOUNTER — Other Ambulatory Visit: Payer: Self-pay

## 2017-09-26 ENCOUNTER — Inpatient Hospital Stay (HOSPITAL_COMMUNITY): Payer: Medicare Other | Admitting: Certified Registered"

## 2017-09-26 ENCOUNTER — Encounter (HOSPITAL_COMMUNITY): Payer: Self-pay

## 2017-09-26 ENCOUNTER — Inpatient Hospital Stay (HOSPITAL_COMMUNITY)
Admission: RE | Admit: 2017-09-26 | Discharge: 2017-09-29 | DRG: 470 | Disposition: A | Discharge status: Skilled Nursing Facility | Payer: Medicare Other | Attending: Orthopaedic Surgery | Admitting: Orthopaedic Surgery

## 2017-09-26 DIAGNOSIS — D62 Acute posthemorrhagic anemia: Secondary | ICD-10-CM | POA: Diagnosis not present

## 2017-09-26 DIAGNOSIS — K5900 Constipation, unspecified: Secondary | ICD-10-CM | POA: Diagnosis not present

## 2017-09-26 DIAGNOSIS — T8484XA Pain due to internal orthopedic prosthetic devices, implants and grafts, initial encounter: Secondary | ICD-10-CM | POA: Diagnosis present

## 2017-09-26 DIAGNOSIS — Z79899 Other long term (current) drug therapy: Secondary | ICD-10-CM

## 2017-09-26 DIAGNOSIS — I10 Essential (primary) hypertension: Secondary | ICD-10-CM | POA: Diagnosis present

## 2017-09-26 DIAGNOSIS — M87051 Idiopathic aseptic necrosis of right femur: Secondary | ICD-10-CM | POA: Diagnosis not present

## 2017-09-26 DIAGNOSIS — Z96641 Presence of right artificial hip joint: Secondary | ICD-10-CM | POA: Diagnosis not present

## 2017-09-26 DIAGNOSIS — F329 Major depressive disorder, single episode, unspecified: Secondary | ICD-10-CM | POA: Diagnosis present

## 2017-09-26 DIAGNOSIS — M62838 Other muscle spasm: Secondary | ICD-10-CM | POA: Diagnosis not present

## 2017-09-26 DIAGNOSIS — R2681 Unsteadiness on feet: Secondary | ICD-10-CM | POA: Diagnosis not present

## 2017-09-26 DIAGNOSIS — R41841 Cognitive communication deficit: Secondary | ICD-10-CM | POA: Diagnosis not present

## 2017-09-26 DIAGNOSIS — E871 Hypo-osmolality and hyponatremia: Secondary | ICD-10-CM | POA: Diagnosis not present

## 2017-09-26 DIAGNOSIS — Z882 Allergy status to sulfonamides status: Secondary | ICD-10-CM

## 2017-09-26 DIAGNOSIS — M8788 Other osteonecrosis, other site: Secondary | ICD-10-CM | POA: Diagnosis present

## 2017-09-26 DIAGNOSIS — Y792 Prosthetic and other implants, materials and accessory orthopedic devices associated with adverse incidents: Secondary | ICD-10-CM | POA: Diagnosis present

## 2017-09-26 DIAGNOSIS — Z881 Allergy status to other antibiotic agents status: Secondary | ICD-10-CM

## 2017-09-26 DIAGNOSIS — Z7982 Long term (current) use of aspirin: Secondary | ICD-10-CM | POA: Diagnosis not present

## 2017-09-26 DIAGNOSIS — M1611 Unilateral primary osteoarthritis, right hip: Secondary | ICD-10-CM | POA: Diagnosis present

## 2017-09-26 DIAGNOSIS — Z79890 Hormone replacement therapy (postmenopausal): Secondary | ICD-10-CM | POA: Diagnosis not present

## 2017-09-26 DIAGNOSIS — R339 Retention of urine, unspecified: Secondary | ICD-10-CM | POA: Diagnosis not present

## 2017-09-26 DIAGNOSIS — E039 Hypothyroidism, unspecified: Secondary | ICD-10-CM | POA: Diagnosis present

## 2017-09-26 DIAGNOSIS — F419 Anxiety disorder, unspecified: Secondary | ICD-10-CM | POA: Diagnosis present

## 2017-09-26 DIAGNOSIS — Z471 Aftercare following joint replacement surgery: Secondary | ICD-10-CM | POA: Diagnosis not present

## 2017-09-26 DIAGNOSIS — Z96649 Presence of unspecified artificial hip joint: Secondary | ICD-10-CM

## 2017-09-26 DIAGNOSIS — R278 Other lack of coordination: Secondary | ICD-10-CM | POA: Diagnosis not present

## 2017-09-26 DIAGNOSIS — K219 Gastro-esophageal reflux disease without esophagitis: Secondary | ICD-10-CM | POA: Diagnosis present

## 2017-09-26 DIAGNOSIS — M6281 Muscle weakness (generalized): Secondary | ICD-10-CM | POA: Diagnosis not present

## 2017-09-26 DIAGNOSIS — R42 Dizziness and giddiness: Secondary | ICD-10-CM | POA: Diagnosis not present

## 2017-09-26 HISTORY — PX: HARDWARE REMOVAL: SHX979

## 2017-09-26 HISTORY — PX: TOTAL HIP ARTHROPLASTY: SHX124

## 2017-09-26 LAB — CBC
HCT: 23.3 % — ABNORMAL LOW (ref 36.0–46.0)
Hemoglobin: 7.5 g/dL — ABNORMAL LOW (ref 12.0–15.0)
MCH: 34.1 pg — ABNORMAL HIGH (ref 26.0–34.0)
MCHC: 32.2 g/dL (ref 30.0–36.0)
MCV: 105.9 fL — ABNORMAL HIGH (ref 78.0–100.0)
Platelets: 135 10*3/uL — ABNORMAL LOW (ref 150–400)
RBC: 2.2 MIL/uL — ABNORMAL LOW (ref 3.87–5.11)
RDW: 13.1 % (ref 11.5–15.5)
WBC: 10 10*3/uL (ref 4.0–10.5)

## 2017-09-26 LAB — PREPARE RBC (CROSSMATCH)

## 2017-09-26 SURGERY — ARTHROPLASTY, HIP, TOTAL,POSTERIOR APPROACH
Anesthesia: Monitor Anesthesia Care | Site: Hip | Laterality: Right

## 2017-09-26 MED ORDER — KETOROLAC TROMETHAMINE 30 MG/ML IJ SOLN
INTRAMUSCULAR | Status: AC
  Administered 2017-09-26: 30 mg via INTRAVENOUS
  Filled 2017-09-26: qty 1

## 2017-09-26 MED ORDER — ALBUMIN HUMAN 5 % IV SOLN
INTRAVENOUS | Status: DC | PRN
  Administered 2017-09-26 (×2): via INTRAVENOUS

## 2017-09-26 MED ORDER — LACTATED RINGERS IV BOLUS
250.0000 mL | Freq: Once | INTRAVENOUS | Status: AC
  Administered 2017-09-26: 250 mL via INTRAVENOUS

## 2017-09-26 MED ORDER — CHLORHEXIDINE GLUCONATE 4 % EX LIQD: 60.0000 mL | Freq: Once | CUTANEOUS | Status: DC

## 2017-09-26 MED ORDER — METHOCARBAMOL 500 MG PO TABS
ORAL_TABLET | ORAL | Status: AC
  Administered 2017-09-26: 500 mg via ORAL
  Filled 2017-09-26: qty 1

## 2017-09-26 MED ORDER — VANCOMYCIN HCL 1000 MG IV SOLR
INTRAVENOUS | Status: DC | PRN
  Administered 2017-09-26 (×2): 1000 mg

## 2017-09-26 MED ORDER — ONDANSETRON HCL 4 MG/2ML IJ SOLN
INTRAMUSCULAR | Status: DC | PRN
  Administered 2017-09-26: 4 mg via INTRAVENOUS

## 2017-09-26 MED ORDER — ASPIRIN 81 MG PO CHEW
81.0000 mg | CHEWABLE_TABLET | Freq: Two times a day (BID) | ORAL | Status: DC
  Administered 2017-09-27 – 2017-09-29 (×6): 81 mg via ORAL
  Filled 2017-09-26 (×8): qty 1

## 2017-09-26 MED ORDER — ONDANSETRON HCL 4 MG PO TABS: 4.0000 mg | ORAL_TABLET | Freq: Four times a day (QID) | ORAL | Status: DC | PRN

## 2017-09-26 MED ORDER — PROPOFOL 10 MG/ML IV BOLUS
INTRAVENOUS | Status: DC | PRN
  Administered 2017-09-26 (×2): 20 mg via INTRAVENOUS
  Administered 2017-09-26: 10 mg via INTRAVENOUS

## 2017-09-26 MED ORDER — METHOCARBAMOL 500 MG PO TABS
ORAL_TABLET | ORAL | Status: AC
  Filled 2017-09-26: qty 1

## 2017-09-26 MED ORDER — MIDAZOLAM HCL 2 MG/2ML IJ SOLN
INTRAMUSCULAR | Status: AC
  Filled 2017-09-26: qty 2

## 2017-09-26 MED ORDER — PROPOFOL 500 MG/50ML IV EMUL
INTRAVENOUS | Status: DC | PRN
  Administered 2017-09-26: 75 ug/kg/min via INTRAVENOUS

## 2017-09-26 MED ORDER — SODIUM CHLORIDE 0.9 % IV SOLN
INTRAVENOUS | Status: DC
  Administered 2017-09-27: 03:00:00 via INTRAVENOUS

## 2017-09-26 MED ORDER — FENTANYL CITRATE (PF) 250 MCG/5ML IJ SOLN
INTRAMUSCULAR | Status: AC
  Filled 2017-09-26: qty 5

## 2017-09-26 MED ORDER — TRANEXAMIC ACID 1000 MG/10ML IV SOLN
1000.0000 mg | Freq: Once | INTRAVENOUS | Status: AC
  Administered 2017-09-27: 1000 mg via INTRAVENOUS
  Filled 2017-09-26: qty 10

## 2017-09-26 MED ORDER — ACETAMINOPHEN 325 MG PO TABS: 325.0000 mg | ORAL_TABLET | Freq: Four times a day (QID) | ORAL | Status: DC | PRN

## 2017-09-26 MED ORDER — LACTATED RINGERS IV BOLUS
500.0000 mL | Freq: Once | INTRAVENOUS | Status: AC
  Administered 2017-09-26: 500 mL via INTRAVENOUS

## 2017-09-26 MED ORDER — ONDANSETRON HCL 4 MG PO TABS: 4.0000 mg | ORAL_TABLET | Freq: Three times a day (TID) | ORAL | 0 refills | Status: DC | PRN

## 2017-09-26 MED ORDER — METHOCARBAMOL 500 MG PO TABS
500.0000 mg | ORAL_TABLET | Freq: Four times a day (QID) | ORAL | Status: DC | PRN
  Administered 2017-09-26 – 2017-09-29 (×6): 500 mg via ORAL
  Filled 2017-09-26 (×5): qty 1

## 2017-09-26 MED ORDER — ACETAMINOPHEN 500 MG PO TABS
1000.0000 mg | ORAL_TABLET | Freq: Four times a day (QID) | ORAL | Status: AC
  Administered 2017-09-27 (×4): 1000 mg via ORAL
  Filled 2017-09-26 (×4): qty 2

## 2017-09-26 MED ORDER — SENNOSIDES-DOCUSATE SODIUM 8.6-50 MG PO TABS: 1.0000 | ORAL_TABLET | Freq: Every evening | ORAL | 1 refills | Status: DC | PRN

## 2017-09-26 MED ORDER — PROPOFOL 1000 MG/100ML IV EMUL
INTRAVENOUS | Status: AC
  Filled 2017-09-26: qty 100

## 2017-09-26 MED ORDER — OXYCODONE HCL 5 MG PO TABS
ORAL_TABLET | ORAL | Status: AC
  Administered 2017-09-26: 5 mg via ORAL
  Filled 2017-09-26: qty 1

## 2017-09-26 MED ORDER — 0.9 % SODIUM CHLORIDE (POUR BTL) OPTIME
TOPICAL | Status: DC | PRN
  Administered 2017-09-26: 1000 mL

## 2017-09-26 MED ORDER — LEVOTHYROXINE SODIUM 75 MCG PO TABS
150.0000 ug | ORAL_TABLET | Freq: Every day | ORAL | Status: DC
  Administered 2017-09-27 – 2017-09-29 (×3): 150 ug via ORAL
  Filled 2017-09-26 (×4): qty 2

## 2017-09-26 MED ORDER — METOCLOPRAMIDE HCL 5 MG PO TABS: 5.0000 mg | ORAL_TABLET | Freq: Three times a day (TID) | ORAL | Status: DC | PRN

## 2017-09-26 MED ORDER — ONDANSETRON HCL 4 MG/2ML IJ SOLN
INTRAMUSCULAR | Status: AC
  Filled 2017-09-26: qty 2

## 2017-09-26 MED ORDER — METOCLOPRAMIDE HCL 5 MG/ML IJ SOLN: 5.0000 mg | Freq: Three times a day (TID) | INTRAMUSCULAR | Status: DC | PRN

## 2017-09-26 MED ORDER — OXYCODONE HCL ER 10 MG PO T12A
10.0000 mg | EXTENDED_RELEASE_TABLET | Freq: Two times a day (BID) | ORAL | Status: DC
  Administered 2017-09-27 – 2017-09-29 (×6): 10 mg via ORAL
  Filled 2017-09-26 (×8): qty 1

## 2017-09-26 MED ORDER — DEXAMETHASONE SODIUM PHOSPHATE 10 MG/ML IJ SOLN
10.0000 mg | Freq: Once | INTRAMUSCULAR | Status: AC
  Administered 2017-09-27: 10 mg via INTRAVENOUS
  Filled 2017-09-26 (×2): qty 1

## 2017-09-26 MED ORDER — MENTHOL 3 MG MT LOZG: 1.0000 | LOZENGE | OROMUCOSAL | Status: DC | PRN

## 2017-09-26 MED ORDER — IRBESARTAN 300 MG PO TABS
300.0000 mg | ORAL_TABLET | Freq: Every day | ORAL | Status: DC
  Administered 2017-09-27 – 2017-09-29 (×3): 300 mg via ORAL
  Filled 2017-09-26 (×4): qty 1

## 2017-09-26 MED ORDER — OXYCODONE HCL 5 MG PO TABS: 5.0000 mg | ORAL_TABLET | ORAL | 0 refills | Status: DC | PRN

## 2017-09-26 MED ORDER — OXYCODONE HCL 5 MG PO TABS
5.0000 mg | ORAL_TABLET | ORAL | Status: DC | PRN
  Administered 2017-09-26: 5 mg via ORAL
  Administered 2017-09-26: 10 mg via ORAL
  Administered 2017-09-28: 5 mg via ORAL
  Administered 2017-09-29: 10 mg via ORAL
  Filled 2017-09-26: qty 1
  Filled 2017-09-26: qty 2

## 2017-09-26 MED ORDER — GABAPENTIN 300 MG PO CAPS
300.0000 mg | ORAL_CAPSULE | Freq: Three times a day (TID) | ORAL | Status: DC
  Administered 2017-09-27 – 2017-09-29 (×8): 300 mg via ORAL
  Filled 2017-09-26 (×10): qty 1

## 2017-09-26 MED ORDER — POLYETHYLENE GLYCOL 3350 17 G PO PACK: 17.0000 g | PACK | Freq: Every day | ORAL | Status: DC | PRN

## 2017-09-26 MED ORDER — VANCOMYCIN HCL 1000 MG IV SOLR
INTRAVENOUS | Status: AC
  Filled 2017-09-26: qty 1000

## 2017-09-26 MED ORDER — KETOROLAC TROMETHAMINE 30 MG/ML IJ SOLN
30.0000 mg | Freq: Four times a day (QID) | INTRAMUSCULAR | Status: DC
  Administered 2017-09-26 – 2017-09-27 (×2): 30 mg via INTRAVENOUS
  Filled 2017-09-26 (×2): qty 1

## 2017-09-26 MED ORDER — ONDANSETRON HCL 4 MG/2ML IJ SOLN: 4.0000 mg | Freq: Four times a day (QID) | INTRAMUSCULAR | Status: DC | PRN

## 2017-09-26 MED ORDER — HYDROMORPHONE HCL 1 MG/ML IJ SOLN: 0.5000 mg | INTRAMUSCULAR | Status: DC | PRN

## 2017-09-26 MED ORDER — DOCUSATE SODIUM 100 MG PO CAPS
100.0000 mg | ORAL_CAPSULE | Freq: Two times a day (BID) | ORAL | Status: DC
  Administered 2017-09-27 – 2017-09-29 (×6): 100 mg via ORAL
  Filled 2017-09-26 (×8): qty 1

## 2017-09-26 MED ORDER — HYDROMORPHONE HCL 1 MG/ML IJ SOLN
0.2500 mg | INTRAMUSCULAR | Status: DC | PRN
  Administered 2017-09-26 (×2): 0.5 mg via INTRAVENOUS

## 2017-09-26 MED ORDER — SORBITOL 70 % SOLN: 30.0000 mL | Freq: Every day | Status: DC | PRN

## 2017-09-26 MED ORDER — ALBUMIN HUMAN 5 % IV SOLN
INTRAVENOUS | Status: AC
  Administered 2017-09-26: 12.5 g via INTRAVENOUS
  Filled 2017-09-26: qty 500

## 2017-09-26 MED ORDER — SODIUM CHLORIDE 0.9 % IV SOLN
INTRAVENOUS | Status: DC | PRN
  Administered 2017-09-26: 25 ug/min via INTRAVENOUS

## 2017-09-26 MED ORDER — OXYCODONE HCL 5 MG PO TABS: 10.0000 mg | ORAL_TABLET | ORAL | Status: DC | PRN

## 2017-09-26 MED ORDER — SODIUM CHLORIDE 0.9% IV SOLUTION
Freq: Once | INTRAVENOUS | Status: AC
  Administered 2017-09-27: via INTRAVENOUS

## 2017-09-26 MED ORDER — ALUM & MAG HYDROXIDE-SIMETH 200-200-20 MG/5ML PO SUSP: 30.0000 mL | ORAL | Status: DC | PRN

## 2017-09-26 MED ORDER — MAGNESIUM CITRATE PO SOLN: 1.0000 | Freq: Once | ORAL | Status: DC | PRN

## 2017-09-26 MED ORDER — ONDANSETRON HCL 4 MG/2ML IJ SOLN: 4.0000 mg | Freq: Once | INTRAMUSCULAR | Status: DC | PRN

## 2017-09-26 MED ORDER — DOXYCYCLINE HYCLATE 100 MG PO TABS: 100.0000 mg | ORAL_TABLET | Freq: Two times a day (BID) | ORAL | 0 refills | Status: DC

## 2017-09-26 MED ORDER — OXYCODONE HCL ER 10 MG PO T12A: 10.0000 mg | EXTENDED_RELEASE_TABLET | Freq: Two times a day (BID) | ORAL | 0 refills | Status: AC

## 2017-09-26 MED ORDER — HYDROMORPHONE HCL 1 MG/ML IJ SOLN
INTRAMUSCULAR | Status: AC
  Administered 2017-09-26: 0.5 mg via INTRAVENOUS
  Filled 2017-09-26: qty 1

## 2017-09-26 MED ORDER — METHOCARBAMOL 750 MG PO TABS: 750.0000 mg | ORAL_TABLET | Freq: Two times a day (BID) | ORAL | 0 refills | Status: DC | PRN

## 2017-09-26 MED ORDER — OXYCODONE HCL 5 MG PO TABS
ORAL_TABLET | ORAL | Status: AC
  Filled 2017-09-26: qty 2

## 2017-09-26 MED ORDER — BUPIVACAINE HCL (PF) 0.5 % IJ SOLN
INTRAMUSCULAR | Status: DC | PRN
  Administered 2017-09-26: 3 mL via INTRATHECAL

## 2017-09-26 MED ORDER — METHOCARBAMOL 1000 MG/10ML IJ SOLN: 500.0000 mg | Freq: Four times a day (QID) | INTRAVENOUS | Status: DC | PRN

## 2017-09-26 MED ORDER — MIDAZOLAM HCL 5 MG/5ML IJ SOLN
INTRAMUSCULAR | Status: DC | PRN
  Administered 2017-09-26: 2 mg via INTRAVENOUS

## 2017-09-26 MED ORDER — DESVENLAFAXINE SUCCINATE ER 100 MG PO TB24
100.0000 mg | ORAL_TABLET | Freq: Every day | ORAL | Status: DC
  Administered 2017-09-27 – 2017-09-29 (×3): 100 mg via ORAL
  Filled 2017-09-26 (×4): qty 1

## 2017-09-26 MED ORDER — DIPHENHYDRAMINE HCL 12.5 MG/5ML PO ELIX: 25.0000 mg | ORAL_SOLUTION | ORAL | Status: DC | PRN

## 2017-09-26 MED ORDER — ASPIRIN EC 81 MG PO TBEC: 81.0000 mg | DELAYED_RELEASE_TABLET | Freq: Two times a day (BID) | ORAL | 0 refills | Status: DC

## 2017-09-26 MED ORDER — CEFAZOLIN SODIUM-DEXTROSE 2-4 GM/100ML-% IV SOLN
INTRAVENOUS | Status: AC
  Filled 2017-09-26: qty 100

## 2017-09-26 MED ORDER — PHENOL 1.4 % MT LIQD: 1.0000 | OROMUCOSAL | Status: DC | PRN

## 2017-09-26 MED ORDER — CEFAZOLIN SODIUM-DEXTROSE 2-4 GM/100ML-% IV SOLN
2.0000 g | Freq: Four times a day (QID) | INTRAVENOUS | Status: AC
  Administered 2017-09-27 (×3): 2 g via INTRAVENOUS
  Filled 2017-09-26 (×3): qty 100

## 2017-09-26 MED ORDER — CEFAZOLIN SODIUM-DEXTROSE 2-3 GM-%(50ML) IV SOLR
INTRAVENOUS | Status: DC | PRN
  Administered 2017-09-26: 2 g via INTRAVENOUS

## 2017-09-26 MED ORDER — SODIUM CHLORIDE 0.9 % IR SOLN
Status: DC | PRN
  Administered 2017-09-26: 3000 mL

## 2017-09-26 MED ORDER — PROMETHAZINE HCL 25 MG PO TABS: 25.0000 mg | ORAL_TABLET | Freq: Four times a day (QID) | ORAL | 1 refills | Status: DC | PRN

## 2017-09-26 MED ORDER — ALBUMIN HUMAN 5 % IV SOLN
12.5000 g | Freq: Once | INTRAVENOUS | Status: AC | PRN
  Administered 2017-09-26 (×2): 12.5 g via INTRAVENOUS

## 2017-09-26 SURGICAL SUPPLY — 68 items
BLADE SAGITTAL (BLADE) ×2
BLADE SAW THK.89X75X18XSGTL (BLADE) ×1 IMPLANT
CABLE CERLAGE W/CRIMP 1.8 (Cable) ×2 IMPLANT
CABLE CERLAGE W/CRIMP 1.8MM (Cable) ×1 IMPLANT
CLOSURE STERI-STRIP 1/2X4 (GAUZE/BANDAGES/DRESSINGS) ×2
CLSR STERI-STRIP ANTIMIC 1/2X4 (GAUZE/BANDAGES/DRESSINGS) ×4 IMPLANT
COVER SURGICAL LIGHT HANDLE (MISCELLANEOUS) ×3 IMPLANT
CUP SECTOR GRIPTON 50MM (Cup) ×3 IMPLANT
DRAPE C-ARM 42X72 X-RAY (DRAPES) ×3 IMPLANT
DRAPE HALF SHEET 40X57 (DRAPES) ×3 IMPLANT
DRAPE HIP W/POCKET STRL (DRAPE) ×3 IMPLANT
DRAPE IMP U-DRAPE 54X76 (DRAPES) ×3 IMPLANT
DRAPE INCISE IOBAN 85X60 (DRAPES) ×3 IMPLANT
DRAPE ORTHO SPLIT 77X108 STRL (DRAPES) ×4
DRAPE POUCH INSTRU U-SHP 10X18 (DRAPES) ×3 IMPLANT
DRAPE STERI IOBAN 125X83 (DRAPES) ×3 IMPLANT
DRAPE SURG 17X23 STRL (DRAPES) ×3 IMPLANT
DRAPE SURG ORHT 6 SPLT 77X108 (DRAPES) ×2 IMPLANT
DRAPE U-SHAPE 47X51 STRL (DRAPES) ×3 IMPLANT
DRSG AQUACEL AG ADV 3.5X10 (GAUZE/BANDAGES/DRESSINGS) ×3 IMPLANT
DRSG AQUACEL AG ADV 3.5X14 (GAUZE/BANDAGES/DRESSINGS) ×3 IMPLANT
DURAPREP 26ML APPLICATOR (WOUND CARE) ×6 IMPLANT
ELECT CAUTERY BLADE 6.4 (BLADE) ×3 IMPLANT
ELECT REM PT RETURN 9FT ADLT (ELECTROSURGICAL) ×3
ELECTRODE REM PT RTRN 9FT ADLT (ELECTROSURGICAL) ×1 IMPLANT
EVACUATOR 1/8 PVC DRAIN (DRAIN) ×3 IMPLANT
EXTRACTOR BROKEN SCREW 3 (INSTRUMENTS) ×3 IMPLANT
EXTRACTOR BROKEN SCREW 4 (INSTRUMENTS) ×6 IMPLANT
EXTRACTOR BROKEN SCREW 8X05 (INSTRUMENTS) ×6 IMPLANT
GLOVE BIOGEL PI IND STRL 7.0 (GLOVE) ×1 IMPLANT
GLOVE BIOGEL PI INDICATOR 7.0 (GLOVE) ×2
GLOVE ECLIPSE 7.0 STRL STRAW (GLOVE) ×6 IMPLANT
GLOVE SKINSENSE NS SZ7.5 (GLOVE) ×4
GLOVE SKINSENSE STRL SZ7.5 (GLOVE) ×2 IMPLANT
GLOVE SURG SS PI 7.0 STRL IVOR (GLOVE) ×9 IMPLANT
GLOVE SURG SYN 7.5  E (GLOVE) ×6
GLOVE SURG SYN 7.5 E (GLOVE) ×3 IMPLANT
GOWN STRL REIN XL XLG (GOWN DISPOSABLE) ×3 IMPLANT
HANDPIECE INTERPULSE COAX TIP (DISPOSABLE) ×2
HEAD FEMORAL 32 CERAMIC (Hips) ×3 IMPLANT
HOOD PEEL AWAY FLYTE STAYCOOL (MISCELLANEOUS) ×6 IMPLANT
KIT BASIN OR (CUSTOM PROCEDURE TRAY) ×3 IMPLANT
KIT TURNOVER KIT B (KITS) ×3 IMPLANT
LINER ACET PNNCL PLUS4 NEUTRAL (Hips) ×1 IMPLANT
LINER ACETABULAR 32X50 (Liner) ×6 IMPLANT
MANIFOLD NEPTUNE II (INSTRUMENTS) ×3 IMPLANT
NS IRRIG 1000ML POUR BTL (IV SOLUTION) ×3 IMPLANT
PACK TOTAL JOINT (CUSTOM PROCEDURE TRAY) ×3 IMPLANT
PACK UNIVERSAL I (CUSTOM PROCEDURE TRAY) ×3 IMPLANT
PAD ARMBOARD 7.5X6 YLW CONV (MISCELLANEOUS) ×6 IMPLANT
PINNACLE PLUS 4 NEUTRAL (Hips) ×3 IMPLANT
SAW OSC TIP CART 19.5X105X1.3 (SAW) ×3 IMPLANT
SCREW 6.5MMX25MM (Screw) ×3 IMPLANT
SET HNDPC FAN SPRY TIP SCT (DISPOSABLE) ×1 IMPLANT
SLEEVE SOLUTION STR 8IN 12MM (Hips) ×3 IMPLANT
STAPLER VISISTAT 35W (STAPLE) IMPLANT
STOCKINETTE IMPERVIOUS 9X36 MD (GAUZE/BANDAGES/DRESSINGS) ×3 IMPLANT
SUT ETHIBOND NAB CT1 #1 30IN (SUTURE) ×3 IMPLANT
SUT ETHILON 2 0 FS 18 (SUTURE) ×9 IMPLANT
SUT MNCRL AB 3-0 PS2 18 (SUTURE) ×3 IMPLANT
SUT VIC AB 1 CT1 27 (SUTURE) ×6
SUT VIC AB 1 CT1 27XBRD ANBCTR (SUTURE) ×3 IMPLANT
SUT VIC AB 1 CTX 36 (SUTURE) ×2
SUT VIC AB 1 CTX36XBRD ANBCTR (SUTURE) ×1 IMPLANT
SUT VIC AB 2-0 CT1 27 (SUTURE) ×14
SUT VIC AB 2-0 CT1 TAPERPNT 27 (SUTURE) ×7 IMPLANT
TOWEL OR 17X26 10 PK STRL BLUE (TOWEL DISPOSABLE) ×3 IMPLANT
TRAY CATH 16FR W/PLASTIC CATH (SET/KITS/TRAYS/PACK) ×3 IMPLANT

## 2017-09-26 NOTE — Anesthesia Postprocedure Evaluation (Addendum)
Anesthesia Post Note  Patient: Kelli Mason  Procedure(s) Performed: RIGHT TOTAL HIP ARTHROPLASTY POSTERIOR WITH HARDWARE REMOVAL (Right Hip) HARDWARE REMOVAL (Right Hip)     Patient location during evaluation: PACU Anesthesia Type: MAC Level of consciousness: awake Pain management: pain level controlled Respiratory status: spontaneous breathing Cardiovascular status: stable Postop Assessment: no apparent nausea or vomiting Anesthetic complications: no Comments: SBP 90s-100s in PACU. Pt mentation normal, and states she feels fine. CBC sent, but lab misplaced sample. Discussed with Dr. Louanne Skye and stated I would resend the CBC and send her to the floor. He said he will check on her in ~ 2 hours.  Prior to being able to d/c from PACU, CBC was resulted showing hgb drop to 7.5 from 14.1. Additionally, pt got another dose of pain medication and SBP in 80s. Discussed with Louanne Skye and will transfuse 2 units and transfer to floor.    Last Vitals:  Vitals:   09/26/17 1655 09/26/17 1710  BP: 102/63 (!) 91/55  Pulse: 64 64  Resp: 17 15  Temp:    SpO2: 100% 99%    Last Pain:  Vitals:   09/26/17 1800  TempSrc:   PainSc: 3                  CHARLENE GREEN

## 2017-09-26 NOTE — Anesthesia Preprocedure Evaluation (Addendum)
Anesthesia Evaluation  Patient identified by MRN, date of birth, ID band Patient awake    Reviewed: Allergy & Precautions, NPO status , Patient's Chart, lab work & pertinent test results  Airway Mallampati: II  TM Distance: >3 FB Neck ROM: Full    Dental   Pulmonary neg pulmonary ROS,    breath sounds clear to auscultation       Cardiovascular hypertension, Pt. on medications  Rhythm:Regular Rate:Normal     Neuro/Psych Anxiety Depression negative neurological ROS     GI/Hepatic Neg liver ROS, GERD  ,  Endo/Other  Hypothyroidism   Renal/GU negative Renal ROS     Musculoskeletal  (+) Arthritis ,   Abdominal   Peds  Hematology negative hematology ROS (+)   Anesthesia Other Findings   Reproductive/Obstetrics                             Lab Results  Component Value Date   WBC 5.5 09/15/2017   HGB 14.1 09/15/2017   HCT 44.8 09/15/2017   MCV 104.7 (H) 09/15/2017   PLT 223 09/15/2017   Lab Results  Component Value Date   CREATININE 1.04 (H) 09/15/2017   BUN 14 09/15/2017   NA 144 09/15/2017   K 4.1 09/15/2017   CL 106 09/15/2017   CO2 26 09/15/2017    Anesthesia Physical Anesthesia Plan  ASA: II  Anesthesia Plan: MAC and Spinal   Post-op Pain Management:    Induction: Intravenous  PONV Risk Score and Plan: 2 and Ondansetron, Propofol infusion and Treatment may vary due to age or medical condition  Airway Management Planned: Natural Airway and Simple Face Mask  Additional Equipment:   Intra-op Plan:   Post-operative Plan:   Informed Consent: I have reviewed the patients History and Physical, chart, labs and discussed the procedure including the risks, benefits and alternatives for the proposed anesthesia with the patient or authorized representative who has indicated his/her understanding and acceptance.     Plan Discussed with: CRNA  Anesthesia Plan Comments:         Anesthesia Quick Evaluation

## 2017-09-26 NOTE — Addendum Note (Signed)
Addendum  created 09/26/17 2058 by Nolon Nations, MD   Sign clinical note

## 2017-09-26 NOTE — Anesthesia Procedure Notes (Addendum)
Spinal  Patient location during procedure: OR Start time: 09/26/2017 11:33 AM End time: 09/26/2017 11:39 AM Staffing Anesthesiologist: Suzette Battiest, MD Performed: anesthesiologist  Preanesthetic Checklist Completed: patient identified, site marked, surgical consent, pre-op evaluation, timeout performed, IV checked, risks and benefits discussed and monitors and equipment checked Spinal Block Patient position: sitting Prep: site prepped and draped and DuraPrep Patient monitoring: blood pressure, continuous pulse ox and heart rate Approach: midline Location: L4-5 Injection technique: single-shot Needle Needle type: Pencan  Needle gauge: 24 G Needle length: 9 cm Assessment Sensory level: T6

## 2017-09-26 NOTE — Transfer of Care (Addendum)
Immediate Anesthesia Transfer of Care Note  Patient: Kelli Mason  Procedure(s) Performed: RIGHT TOTAL HIP ARTHROPLASTY POSTERIOR WITH HARDWARE REMOVAL (Right Hip) HARDWARE REMOVAL (Right Hip)  Patient Location: PACU  Anesthesia Type:MAC  Level of Consciousness: awake, alert  and oriented  Airway & Oxygen Therapy: Patient Spontanous Breathing and Patient connected to nasal cannula oxygen  Post-op Assessment: Report given to RN and Post -op Vital signs reviewed and unstable, Anesthesiologist notified  Post vital signs: Reviewed and unstable  Last Vitals:  Vitals Value Taken Time  BP    Temp    Pulse 74 09/26/2017  4:26 PM  Resp 19 09/26/2017  4:26 PM  SpO2 97 % 09/26/2017  4:26 PM  Vitals shown include unvalidated device data.  Last Pain:  Vitals:   09/26/17 0951  TempSrc:   PainSc: 0-No pain         Complications: No apparent anesthesia complications   BP low in PACU. CRNA notified Dr. Nyoka Cowden, orders for albumin received. Patient alert and oriented. Patient not symptomatic of hypotension.

## 2017-09-26 NOTE — Op Note (Signed)
RIGHT TOTAL HIP ARTHROPLASTY POSTERIOR WITH HARDWARE REMOVAL,   Procedure Note CAYDENCE KOENIG   [id number]  Pre-op Diagnosis: right hip degenerative joint disease, avascular necrosis, retained hardware     Post-op Diagnosis: same   Operative Procedures  1. Removal of synthes DHS and partially threaded screw from right proximal femur and hip 2. Conversion of previous right hip surgery to total hip replacment  Personnel  Surgeon(s): Leandrew Koyanagi, MD   Anesthesia: spinal  Prosthesis:  Acetabulum: 50 mm Pinnacle Femur: AML 12 STD 8 inch Head: 32 mm size: +1 Bearing Type: ceramic on poly  Date of Service: 09/26/2017   Indication: 67 y.o. year old female with a history of hip pain that has failed conservative management. After risk and benefits of the surgery were explained, the patient elected to proceed.   Procedure:  After informed consent was obtained and understanding of the risk were voiced including but not limited to bleeding, infection, damage to surrounding structures including nerves and vessels, blood clots, leg length inequality, dislocation and the failure to achieve desired results, the operative extremity was marked with verbal confirmation of the patient in the holding area.   The patient was then brought to the operating room and transported to the operating room table and placed in the lateral decubitus position. The operative limb was then prepped and draped in the usual sterile fashion and preoperative antibiotics were administered.  A time out was performed prior to the start of surgery confirming the correct extremity, preoperative antibiotic administration, as well as team members, implants and instruments available for the case. Correct surgical site was also confirmed with preoperative radiographs. A standard posterior approach to the hip was performed. A capsulotomy was performed and the capsule tagged for later repair. The hip was dislocated.  I then incised  the IT band in order to expose the previous hardware of the right hip.  Soft tissues were cleared off of the hardware.  The proximal to screws were removed without any difficulty.  The distal 2 screws had broken off in the femoral canal.  I was able to remove the screw heads only initially.  I then attempted to remove the sideplate off of the lag screw but I was unsuccessful.  At that point I had to break up the femoral head using oscillating saw and osteotome in order to expose the threads of the lag screw.  I was then able to tap the lateral plate off of the lag screw.  Lag screw was then removed carefully without any difficulty.  I then had to use trephines in order to remove the 2 screws that were broken in the femoral canal.  This was done without difficulty.  Once this was done we then turned our attention to converting the right hip to a total hip replacement.  Soft tissue releases were made.  The femoral neck cut was made at approximately 1 cm proximal to the lesser trochanter. The acetabulum was exposed and labrum resected. We did sequential reaming up to a size 49 mm reamer. The acetabular component, size 50 mm was then impacted into place and position evaluated with guides.  A single 25 mm cancellus screw was placed for extra fixation.  A trial liner was placed.  We then turned our attention to femoral preparation. We began sequential reaming and broaching to a size 12 stem. This size produced good fit and rotational stability. We placed the trial neck and a 32 mm +1 head. Measurements from  center head to lesser trochanter were obtained and compared to preoperative template, and leg lengths were evaluated on the table. The hip was stable in extension and external rotation without impingement. The hip was stable in deep flexion. In 90 degrees of abduction and neutral abduction the hip was stable to 50 degrees of internal rotation. In a position of sleep with hip adducted across the body the hip was stable  to 50 degrees of rotation.  At this point we removed the trial head and neck and turned our attention to the acetabulum. The final polyethylene liner was placed. We then turned back to the femur and removed the trial broach. The final femoral implant was placed.  During insertion of the final implant I did notice a nondisplaced fracture of the posterior aspect of the greater trochanter.  This did not affect the stability of the stem.  However I still felt there was a good idea to remove the stem and to place a cerclage cable around the greater trochanter to just above the lesser trochanter at the level of the calcar.  This was tensioned.  The final femoral stem was then put back down the canal.  The fractures was stable.We then irrigated and dried the trunion, and the head was placed. We placed an 32 mm +1 head. The hip was again reduced, and taken through a range of motion and found to be stable as above. The hip was thoroughly irrigated, and a posterior capsular repair was performed with #5 Ethibond. The wound was irrigated with dilute betadine in normal saline, and then again with normal saline. The deep fascia was closed with #1 vicryl, #0 Vicryl for the deep fat layer, and 2.0 Vicryl Plus for the subcutaneous tissue. The skin was closed with 2.0 nylon.  A sterile dressing was applied. The patient was awakened and transported to the recovery room in stable condition. All sponge, needle, and instrument counts were correct at the end of the case.  Position: lateral decubitus   Complications: none.  Time Out: performed   Drains/Packing: HVAC  Estimated blood loss: see anesthesia record  Returned to Recovery Room: in good condition.   Antibiotics: yes   Mechanical VTE (DVT) Prophylaxis: sequential compression devices, TED thigh-high  Chemical VTE (DVT) Prophylaxis: aspirin  Fluid Replacement  Crystalloid: see anesthesia record Blood: none  FFP: none   Specimens Removed: 1 to pathology    Sponge and Instrument Count Correct? yes   PACU: portable radiograph - low AP pelvis  Admission: inpatient status, start PT & OT POD#1  Plan/RTC: Return in 2 weeks for wound check.  Weight Bearing/Load Lower Extremity: 50% weight bearing Posterior hip precautions  N. Eduard Roux, MD [place] 3:28 PM

## 2017-09-26 NOTE — Discharge Instructions (Signed)

## 2017-09-26 NOTE — H&P (Signed)
PREOPERATIVE H&P  Chief Complaint: right hip degenerative joint disease, avascular necrosis, retained hardware  HPI: Kelli Mason is a 67 y.o. female who presents for surgical treatment of right hip degenerative joint disease, avascular necrosis, retained hardware.  She denies any changes in medical history.  Past Medical History:  Diagnosis Date  . Anemia    yrs ago  . Anxiety   . Arthritis   . Bulging lumbar disc    L4-5  . Depression   . GERD (gastroesophageal reflux disease)    drinks bubbly water  . Hypertension   . Hypothyroidism    Past Surgical History:  Procedure Laterality Date  . broken foot     left  in the 70's  . FRACTURE SURGERY     right femur  . miscarriage     Social History   Socioeconomic History  . Marital status: Single    Spouse name: Not on file  . Number of children: Not on file  . Years of education: Not on file  . Highest education level: Not on file  Occupational History  . Not on file  Social Needs  . Financial resource strain: Not on file  . Food insecurity:    Worry: Not on file    Inability: Not on file  . Transportation needs:    Medical: Not on file    Non-medical: Not on file  Tobacco Use  . Smoking status: Never Smoker  . Smokeless tobacco: Never Used  Substance and Sexual Activity  . Alcohol use: Yes    Alcohol/week: 4.0 standard drinks    Types: 4 Glasses of wine per week    Comment: margarita with [place] food  . Drug use: Not Currently  . Sexual activity: Not on file  Lifestyle  . Physical activity:    Days per week: Not on file    Minutes per session: Not on file  . Stress: Not on file  Relationships  . Social connections:    Talks on phone: Not on file    Gets together: Not on file    Attends religious service: Not on file    Active member of club or organization: Not on file    Attends meetings of clubs or organizations: Not on file    Relationship status: Not on file  Other Topics Concern  . Not  on file  Social History Narrative  . Not on file   History reviewed. No pertinent family history. Allergies  Allergen Reactions  . Sulfa Antibiotics     UNSPECIFIED REACTION   . Keflex [Cephalexin] Rash    About 30 yr ago, she had it   Prior to Admission medications   Medication Sig Start Date End Date Taking? Authorizing Provider  desvenlafaxine (PRISTIQ) 100 MG 24 hr tablet Take 100 mg by mouth daily.  11/11/16  Yes [provider]  hydrochlorothiazide (HYDRODIURIL) 25 MG tablet Take 25 mg by mouth daily. 09/01/17  Yes [provider]  levothyroxine (SYNTHROID, LEVOTHROID) 150 MCG tablet Take 150 mcg by mouth daily before breakfast.  10/17/16  Yes [provider]  olmesartan (BENICAR) 40 MG tablet Take 40 mg by mouth daily. 09/01/17  Yes [provider]  Multiple Vitamin (MULTIVITAMIN WITH MINERALS) TABS tablet Take 1 tablet by mouth daily. Women's Multivitamin    [provider]  tiZANidine (ZANAFLEX) 4 MG tablet TAKE 1 TABLET(4 MG) BY MOUTH EVERY 6 HOURS AS NEEDED FOR MUSCLE SPASMS Patient not taking: Reported on 09/14/2017  12/01/16   Leandrew Koyanagi, MD     Positive ROS: All other systems have been reviewed and were otherwise negative with the exception of those mentioned in the HPI and as above.  Physical Exam: General: Alert, no acute distress Cardiovascular: No pedal edema Respiratory: No cyanosis, no use of accessory musculature GI: abdomen soft Skin: No lesions in the area of chief complaint Neurologic: Sensation intact distally Psychiatric: Patient is competent for consent with normal mood and affect Lymphatic: no lymphedema  MUSCULOSKELETAL: exam stable  Assessment: right hip degenerative joint disease, avascular necrosis, retained hardware  Plan: Plan for Procedure(s): RIGHT TOTAL HIP ARTHROPLASTY POSTERIOR WITH HARDWARE REMOVAL HARDWARE REMOVAL  The risks benefits and alternatives were discussed with the patient  including but not limited to the risks of nonoperative treatment, versus surgical intervention including infection, bleeding, nerve injury,  blood clots, cardiopulmonary complications, morbidity, mortality, among others, and they were willing to proceed.   Preoperative templating of the joint replacement has been completed, documented, and submitted to the Operating Room personnel in order to optimize intra-operative equipment management.  Eduard Roux, MD   09/26/2017 9:46 AM

## 2017-09-27 ENCOUNTER — Other Ambulatory Visit: Payer: Self-pay

## 2017-09-27 ENCOUNTER — Encounter (HOSPITAL_COMMUNITY): Payer: Self-pay | Admitting: Orthopaedic Surgery

## 2017-09-27 LAB — CBC
HEMATOCRIT: 28.4 % — AB (ref 36.0–46.0)
HEMOGLOBIN: 9.3 g/dL — AB (ref 12.0–15.0)
MCH: 31.3 pg (ref 26.0–34.0)
MCHC: 32.7 g/dL (ref 30.0–36.0)
MCV: 95.6 fL (ref 78.0–100.0)
Platelets: 115 10*3/uL — ABNORMAL LOW (ref 150–400)
RBC: 2.97 MIL/uL — AB (ref 3.87–5.11)
RDW: 18 % — ABNORMAL HIGH (ref 11.5–15.5)
WBC: 6.7 10*3/uL (ref 4.0–10.5)

## 2017-09-27 LAB — BASIC METABOLIC PANEL
Anion gap: 14 (ref 5–15)
BUN: 14 mg/dL (ref 8–23)
CHLORIDE: 94 mmol/L — AB (ref 98–111)
CO2: 22 mmol/L (ref 22–32)
Calcium: 7.9 mg/dL — ABNORMAL LOW (ref 8.9–10.3)
Creatinine, Ser: 1.35 mg/dL — ABNORMAL HIGH (ref 0.44–1.00)
GFR calc Af Amer: 46 mL/min — ABNORMAL LOW (ref >60)
GFR calc non Af Amer: 40 mL/min — ABNORMAL LOW (ref >60)
Glucose, Bld: 397 mg/dL — ABNORMAL HIGH (ref 70–99)
Potassium: 3.6 mmol/L (ref 3.5–5.1)
Sodium: 130 mmol/L — ABNORMAL LOW (ref 135–145)

## 2017-09-27 LAB — GLUCOSE, CAPILLARY
GLUCOSE-CAPILLARY: 161 mg/dL — AB (ref 70–99)
Glucose-Capillary: 118 mg/dL — ABNORMAL HIGH (ref 70–99)
Glucose-Capillary: 162 mg/dL — ABNORMAL HIGH (ref 70–99)

## 2017-09-27 MED ORDER — SODIUM CHLORIDE 0.9 % IV SOLN: INTRAVENOUS | Status: DC

## 2017-09-27 MED ORDER — TAMSULOSIN HCL 0.4 MG PO CAPS
0.4000 mg | ORAL_CAPSULE | Freq: Every day | ORAL | Status: AC
  Administered 2017-09-27: 0.4 mg via ORAL
  Filled 2017-09-27 (×2): qty 1

## 2017-09-27 MED ORDER — KETOROLAC TROMETHAMINE 30 MG/ML IJ SOLN
15.0000 mg | Freq: Four times a day (QID) | INTRAMUSCULAR | Status: DC
  Administered 2017-09-27: 15 mg via INTRAVENOUS
  Filled 2017-09-27: qty 1

## 2017-09-27 MED ORDER — KCL-LACTATED RINGERS 20 MEQ/L IV SOLN
INTRAVENOUS | Status: DC
  Filled 2017-09-27: qty 1000

## 2017-09-27 MED ORDER — INSULIN ASPART 100 UNIT/ML ~~LOC~~ SOLN
0.0000 [IU] | Freq: Three times a day (TID) | SUBCUTANEOUS | Status: DC
  Administered 2017-09-27: 4 [IU] via SUBCUTANEOUS
  Administered 2017-09-28: 3 [IU] via SUBCUTANEOUS

## 2017-09-27 MED ORDER — INSULIN ASPART 100 UNIT/ML ~~LOC~~ SOLN: 0.0000 [IU] | Freq: Every day | SUBCUTANEOUS | Status: DC

## 2017-09-27 NOTE — NC FL2 (Signed)
[place] LEVEL OF CARE SCREENING TOOL     IDENTIFICATION  Patient Name: Kelli Mason Birthdate: [birth year] Sex: female Admission Date (Current Location): 09/26/2017  [facility] and [place] Number:  Herbalist and Address:  [facility] [place] [address] [place], [place] [address]      Provider Number: [id number]  Attending Physician Name and Address:  Leandrew Koyanagi, MD  Relative Name and Phone Number:  Legrand Como (brother) [phone]    Current Level of Care: Hospital Recommended Level of Care: [place] Prior Approval Number:    Date Approved/Denied:   PASRR Number: [id number]  Discharge Plan:      Current Diagnoses: Patient Active Problem List   Diagnosis Date Noted  . Primary osteoarthritis of right hip 09/26/2017  . History of hip replacement 09/26/2017  . Painful orthopaedic hardware ([place]) 09/26/2017    Orientation RESPIRATION BLADDER Height & Weight     Self, Time, Situation, Place  Normal Continent Weight: 212 lb (96.2 kg) Height:  5' 2.5" (158.8 cm)  BEHAVIORAL SYMPTOMS/MOOD NEUROLOGICAL BOWEL NUTRITION STATUS      Continent Diet(see discharge summary)  AMBULATORY STATUS COMMUNICATION OF NEEDS Skin   Limited Assist Verbally Surgical wounds(right hip surgical incision)                       Personal Care Assistance Level of Assistance  Bathing, Feeding, Dressing, Total care Bathing Assistance: Limited assistance Feeding assistance: Independent Dressing Assistance: Limited assistance Total Care Assistance: Limited assistance   Functional Limitations Info  Sight, Hearing, Speech Sight Info: Adequate Hearing Info: Adequate Speech Info: Adequate    SPECIAL CARE FACTORS FREQUENCY  OT (By licensed OT), PT (By licensed PT)     PT Frequency: 5x weekly OT Frequency: 5x weekly            Contractures Contractures Info: Not present    Additional Factors Info  Allergies    Allergies Info: Allergies: sulfa antibiotics, keflex (cephalexin)           Current Medications (09/27/2017):  This is the current hospital active medication list Current Facility-Administered Medications  Medication Dose Route Frequency Provider Last Rate Last Dose  . 0.9 %  sodium chloride infusion   Intravenous Continuous Dwana Melena L, PA-C      . acetaminophen (TYLENOL) tablet 1,000 mg  1,000 mg Oral Q6H Leandrew Koyanagi, MD   1,000 mg at 09/27/17 0510  . acetaminophen (TYLENOL) tablet 325-650 mg  325-650 mg Oral Q6H PRN Leandrew Koyanagi, MD      . alum & mag hydroxide-simeth (MAALOX/MYLANTA) 200-200-20 MG/5ML suspension 30 mL  30 mL Oral Q4H PRN Leandrew Koyanagi, MD      . aspirin chewable tablet 81 mg  81 mg Oral BID Leandrew Koyanagi, MD   81 mg at 09/27/17 0949  . ceFAZolin (ANCEF) IVPB 2g/100 mL premix  2 g Intravenous Q6H Leandrew Koyanagi, MD 200 mL/hr at 09/27/17 0513 2 g at 09/27/17 0513  . desvenlafaxine (PRISTIQ) 24 hr tablet 100 mg  100 mg Oral Daily Leandrew Koyanagi, MD   100 mg at 09/27/17 1030  . diphenhydrAMINE (BENADRYL) 12.5 MG/5ML elixir 25 mg  25 mg Oral Q4H PRN Leandrew Koyanagi, MD      . docusate sodium (COLACE) capsule 100 mg  100 mg Oral BID Leandrew Koyanagi, MD   100 mg at 09/27/17 0947  . gabapentin (NEURONTIN) capsule 300  mg  300 mg Oral TID Leandrew Koyanagi, MD   300 mg at 09/27/17 0948  . HYDROmorphone (DILAUDID) injection 0.5-1 mg  0.5-1 mg Intravenous Q4H PRN Leandrew Koyanagi, MD      . insulin aspart (novoLOG) injection 0-20 Units  0-20 Units Subcutaneous TID WC Leandrew Koyanagi, MD      . insulin aspart (novoLOG) injection 0-5 Units  0-5 Units Subcutaneous QHS Leandrew Koyanagi, MD      . irbesartan (AVAPRO) tablet 300 mg  300 mg Oral Daily Leandrew Koyanagi, MD   300 mg at 09/27/17 0945  . ketorolac (TORADOL) 30 MG/ML injection 15 mg  15 mg Intravenous Q6H Leandrew Koyanagi, MD      . levothyroxine (SYNTHROID, LEVOTHROID) tablet 150 mcg  150 mcg Oral QAC breakfast Leandrew Koyanagi, MD   150 mcg at  09/27/17 0913  . magnesium citrate solution 1 Bottle  1 Bottle Oral Once PRN Leandrew Koyanagi, MD      . menthol-cetylpyridinium (CEPACOL) lozenge 3 mg  1 lozenge Oral PRN Leandrew Koyanagi, MD       Or  . phenol (CHLORASEPTIC) mouth spray 1 spray  1 spray Mouth/Throat PRN Leandrew Koyanagi, MD      . methocarbamol (ROBAXIN) tablet 500 mg  500 mg Oral Q6H PRN Leandrew Koyanagi, MD   500 mg at 09/26/17 2206   Or  . methocarbamol (ROBAXIN) 500 mg in dextrose 5 % 50 mL IVPB  500 mg Intravenous Q6H PRN Leandrew Koyanagi, MD      . metoCLOPramide (REGLAN) tablet 5-10 mg  5-10 mg Oral Q8H PRN Leandrew Koyanagi, MD       Or  . metoCLOPramide (REGLAN) injection 5-10 mg  5-10 mg Intravenous Q8H PRN Leandrew Koyanagi, MD      . ondansetron [facility]) tablet 4 mg  4 mg Oral Q6H PRN Leandrew Koyanagi, MD       Or  . ondansetron [facility]) injection 4 mg  4 mg Intravenous Q6H PRN Leandrew Koyanagi, MD      . oxyCODONE (Oxy IR/ROXICODONE) immediate release tablet 10-15 mg  10-15 mg Oral Q4H PRN Leandrew Koyanagi, MD      . oxyCODONE (Oxy IR/ROXICODONE) immediate release tablet 5-10 mg  5-10 mg Oral Q4H PRN Leandrew Koyanagi, MD   5 mg at 09/26/17 2206  . oxyCODONE (OXYCONTIN) 12 hr tablet 10 mg  10 mg Oral Q12H Leandrew Koyanagi, MD   10 mg at 09/27/17 0944  . polyethylene glycol (MIRALAX / GLYCOLAX) packet 17 g  17 g Oral Daily PRN Leandrew Koyanagi, MD      . sorbitol 70 % solution 30 mL  30 mL Oral Daily PRN Leandrew Koyanagi, MD         Discharge Medications: Please see discharge summary for a list of discharge medications.  Relevant Imaging Results:  Relevant Lab Results:   Additional Information SSN: [id number]  Alberteen Sam, LCSW

## 2017-09-27 NOTE — Progress Notes (Addendum)
Subjective: 1 Day Post-Op Procedure(s) (LRB): RIGHT TOTAL HIP ARTHROPLASTY POSTERIOR WITH HARDWARE REMOVAL (Right) HARDWARE REMOVAL (Right) Patient reports pain as mild.  Lightheaded and dizzy prior to transfusion last night.  Asymptomatic at rest this am.   Objective: Vital signs in last 24 hours: Temp:  [97.5 F (36.4 C)-98.6 F (37 C)] 98.6 F (37 C) (09/24 0534) Pulse Rate:  [54-128] 67 (09/24 0534) Resp:  [11-23] 16 (09/24 0534) BP: (66-157)/(44-97) 112/65 (09/24 0534) SpO2:  [95 %-100 %] 100 % (09/24 0534) Weight:  [96.2 kg] 96.2 kg (09/23 0927)  Intake/Output from previous day: 09/23 0701 - 09/24 0700 In: 2828.7 [P.O.:100; I.V.:2016.2; Blood:12.5; IV [id number]:710] Out: 6269 [Urine:1800; Drains:370; Blood:1200] Intake/Output this shift: No intake/output data recorded.  Recent Labs    09/26/17 1855  HGB 7.5*   Recent Labs    09/26/17 1855  WBC 10.0  RBC 2.20*  HCT 23.3*  PLT 135*   Recent Labs    09/27/17 0549  NA 130*  K 3.6  CL 94*  CO2 22  BUN 14  CREATININE 1.35*  GLUCOSE 397*  CALCIUM 7.9*   No results for input(s): LABPT, INR in the last 72 hours.  Neurologically intact Neurovascular intact Sensation intact distally Intact pulses distally Dorsiflexion/Plantar flexion intact Incision: dressing C/D/I No cellulitis present Compartment soft    Assessment/Plan: 1 Day Post-Op Procedure(s) (LRB): RIGHT TOTAL HIP ARTHROPLASTY POSTERIOR WITH HARDWARE REMOVAL (Right) HARDWARE REMOVAL (Right) Advance diet Up with therapy  50% weight bearing RLE- POSTERIOR HIP PRECAUTIONS ABLA- transfused with 2 units prbc post-op.  Awaiting new H&H Hyponatremia-continue ns @75  Acute urinary retention-will order flomax (patient says she had hives with a sulfa antibiotic 30-40 years ago) Drain output 370- will likely pull tomorrow D/C to SNF once medically stable- likely within next 1-2 days     Aundra Dubin 09/27/2017, 8:01 AM

## 2017-09-27 NOTE — Evaluation (Signed)
Physical Therapy Evaluation Patient Details Name: Kelli Mason MRN: [id number] DOB: [birth year] Today's Date: 09/27/2017   History of Present Illness  67 y.o. female admitted on 09/27/17 for elective R posterior THA with hardware recomoval.  Pt is PWB 50% post op and had 2 units of PRBCs due to ABLA.  Pt with significant PMH of HTN, anemia, depression, bulging lumbar disc L4-5, anemia, R femur fx surgery (2008).   Clinical Impression  Pt was able to get up OOB to the recliner chair walking a short distance with RW in the room. Education re: posterior hip precautions and WB status reviewed as well as initial HEP exercises.   PT to follow acutely for deficits listed below.       Follow Up Recommendations Supervision/Assistance - 24 hour;Follow surgeon's recommendation for DC plan and follow-up therapies;[facility])    Equipment Recommendations  None recommended by PT    Recommendations for Other Services   NA    Precautions / Restrictions Precautions Precautions: Posterior Hip Precaution Booklet Issued: Yes (comment) Precaution Comments: hip precaution handout given, precautions reviewed, PWB 50% explained.  Required Braces or Orthoses: Other Brace/Splint Other Brace/Splint: hip abduction pillow for in the bed.  Restrictions Weight Bearing Restrictions: Yes RLE Weight Bearing: Partial weight bearing RLE Partial Weight Bearing Percentage or Pounds: 50      Mobility  Bed Mobility Overal bed mobility: Needs Assistance Bed Mobility: Supine to Sit     Supine to sit: Min assist;HOB elevated     General bed mobility comments: Min assist to help progress her right leg over EOB, verbal cues for sequencing, once legs over EOB, hand held assist to pull trunk up to sitting.   Transfers Overall transfer level: Needs assistance Equipment used: Rolling walker (2 wheeled) Transfers: Sit to/from Stand Sit to Stand: Min assist         General transfer comment: Min  assist from bed, pt able to show good hand placement and sequencing to stand, but will need to work on sitting as it was uncontrolled descent down to the lower recliner chair despite use of bil hands on armrests.  I think pt would do better to kick her right foot out from under her to transition to sitting and we discussed this verbally.   Ambulation/Gait Ambulation/Gait assistance: Min assist   Assistive device: Rolling walker (2 wheeled) Gait Pattern/deviations: Step-to pattern;Antalgic     General Gait Details: Pt with moderately antalgic gait pattern, verbal cues that it is ok to put her foot flat and that she did not need to hop.          Balance Overall balance assessment: Needs assistance Sitting-balance support: Feet supported;No upper extremity supported Sitting balance-Leahy Scale: Good     Standing balance support: Bilateral upper extremity supported Standing balance-Leahy Scale: Poor Standing balance comment: needs support of RW and therapist.                              Pertinent Vitals/Pain Pain Assessment: Faces Faces Pain Scale: Hurts even more Pain Location: right leg Pain Descriptors / Indicators: Grimacing;Guarding Pain Intervention(s): Limited activity within patient's tolerance;Monitored during session;Repositioned    Home Living Family/patient expects to be discharged to:: [facility]illed nursing facility([facility]) Living Arrangements: Alone   Type of Home: House Home Access: Stairs to enter Entrance Stairs-Rails: Left Entrance Stairs-Number of Steps: 2 Home Layout: One level Home Equipment: Environmental consultant -  2 wheels;Cane - single point;Bedside commode;Shower seat - built in      Prior Function Level of Independence: Independent         Comments: independent, works part time as a Fish farm manager   Dominant Hand: Left    Extremity/Trunk Assessment   Upper Extremity Assessment Upper  Extremity Assessment: Defer to OT evaluation    Lower Extremity Assessment Lower Extremity Assessment: RLE deficits/detail RLE Deficits / Details: right leg with normal post op pain and weakness, ankle WNL, knee at least 3-/5 for extension, hip 2/5    Cervical / Trunk Assessment Cervical / Trunk Assessment: Other exceptions Cervical / Trunk Exceptions: h/o lumbar disc herniation  Communication   Communication: No difficulties  Cognition Arousal/Alertness: Awake/alert Behavior During Therapy: WFL for tasks assessed/performed Overall Cognitive Status: Within Functional Limits for tasks assessed                                           Exercises Total Joint Exercises Ankle Circles/Pumps: AROM;Both;20 reps Quad Sets: AROM;Right;10 reps Heel Slides: AAROM;Right;10 reps Hip ABduction/ADduction: AAROM;Right;10 reps   Assessment/Plan    PT Assessment Patient needs continued PT services  PT Problem List Decreased strength;Decreased range of motion;Decreased activity tolerance;Decreased balance;Decreased mobility;Decreased knowledge of use of DME;Decreased knowledge of precautions;Pain       PT Treatment Interventions DME instruction;Gait training;Stair training;Functional mobility training;Therapeutic activities;Therapeutic exercise;Balance training;Patient/family education;Manual techniques;Modalities    PT Goals (Current goals can be found in the Care Plan section)  Acute Rehab PT Goals Patient Stated Goal: to go to rehab and get back home so she can get back to [place]. PT Goal Formulation: With patient Time For Goal Achievement: 10/11/17 Potential to Achieve Goals: Good    Frequency 7X/week        AM-PAC PT "6 Clicks" Daily Activity  Outcome Measure Difficulty turning over in bed (including adjusting bedclothes, sheets and blankets)?: Unable Difficulty moving from lying on back to sitting on the side of the bed? : Unable Difficulty sitting down on and  standing up from a chair with arms (e.g., wheelchair, bedside commode, etc,.)?: Unable Help needed moving to and from a bed to chair (including a wheelchair)?: A Little Help needed walking in hospital room?: A Little Help needed climbing 3-5 steps with a railing? : A Lot 6 Click Score: 11    End of Session Equipment Utilized During Treatment: Gait belt Activity Tolerance: Patient limited by pain Patient left: in chair;with call bell/phone within reach Nurse Communication: Mobility status PT Visit Diagnosis: Muscle weakness (generalized) (M62.81);Difficulty in walking, not elsewhere classified (R26.2);Pain Pain - Right/Left: Right Pain - part of body: Hip    Time: 1027-1100 PT Time Calculation (min) (ACUTE ONLY): 33 min   Charges:          Wells Guiles B. , PT, DPT  Acute Rehabilitation [phone] pager #[phone] office   PT Evaluation $PT Eval Moderate Complexity: 1 Mod PT Treatments $Therapeutic Activity: 8-22 mins        09/27/2017, 4:49 PM

## 2017-09-27 NOTE — Plan of Care (Signed)
  Problem: Education: Goal: Knowledge of General Education information will improve Description Including pain rating scale, medication(s)/side effects and non-pharmacologic comfort measures Outcome: Progressing   

## 2017-09-27 NOTE — Progress Notes (Addendum)
Inpatient Diabetes Program Recommendations  AACE/ADA: New Consensus Statement on Inpatient Glycemic Control (2015)  Target Ranges:  Prepandial:   less than 140 mg/dL      Peak postprandial:   less than 180 mg/dL (1-2 hours)      Critically ill patients:  140 - 180 mg/dL   Review of Glycemic Control Results for Kelli Mason, Kelli Mason (MRN [id number]) as of 09/27/2017 15:58  Ref. Range 09/27/2017 12:14  Glucose-Capillary Latest Ref Range: 70 - 99 mg/dL 118 (H)    Diabetes history: None noted Outpatient Diabetes medications:  None Current orders for Inpatient glycemic control:  Novolog resistant tid with meals and HS Inpatient Diabetes Program Recommendations:    Please consider reducing Novolog correction to sensitive. Called RN to discuss recommendations. Thanks,  Adah Perl, RN, BC-ADM Inpatient Diabetes Coordinator Pager [phone] (8a-5p)

## 2017-09-27 NOTE — Plan of Care (Signed)

## 2017-09-27 NOTE — Clinical Social Work Note (Signed)
Clinical Social Work Assessment  Patient Details  Name: Kelli Mason MRN: [id number] Date of Birth: May 31, 1950  Date of referral:  09/27/17               Reason for consult:  Discharge Planning, Care Management Concerns                Permission sought to share information with:  Chartered certified accountant granted to share information::  Yes, Verbal Permission Granted  Name::        Agency::  SNF's  Relationship::     Contact Information:     Housing/Transportation Living arrangements for the past 2 months:  Single Family Home Source of Information:  Patient Patient Interpreter Needed:  None Criminal Activity/Legal Involvement Pertinent to Current Situation/Hospitalization:  No - Comment as needed Significant Relationships:  [employer] Lives with:  Self Do you feel safe going back to the place where you live?  No Need for family participation in patient care:  No (Coment)  Care giving concerns:  CSW received referral for possible SNF placement at time of discharge. Spoke with patient regarding possibility of SNF placement . Patient is currently unable to care for herself at home alone given patient's current needs and fall risk.  Patient expressed understanding of PT/OT recommendation and is agreeable to SNF placement at time of discharge. CSW to continue to follow and assist with discharge planning needs.     Social Worker assessment / plan:  Spoke with patient concerning possibility of rehab at SNF before returning home.    Employment status:  Retired Forensic scientist:  Medicare PT Recommendations:  [place] / Referral to community resources:  [place]  Patient/Family's Response to care:  Patient recognizes need for rehab before returning home and is agreeable to a SNF in [place]. She has a preference for [facility] . CSW explained insurance authorization process. Patient  reported that she wants to get  stronger to be able to come back home.    Patient/Family's Understanding of and Emotional Response to Diagnosis, Current Treatment, and Prognosis:  Patient is realistic regarding therapy needs and expressed being hopeful for [facility] SNF placement. Patient expressed understanding of CSW role and discharge process as well as medical condition. No questions/concerns about plan or treatment.    Emotional Assessment Appearance:  Appears stated age Attitude/Demeanor/Rapport:  Self-Confident, Gracious, Engaged Affect (typically observed):  Accepting, Happy Orientation:  Oriented to Self, Oriented to Situation, Oriented to Place, Oriented to  Time Alcohol / Substance use:  Not Applicable Psych involvement (Current and /or in the community):  No (Comment)  Discharge Needs  Concerns to be addressed:  Discharge Planning Concerns, Care Coordination Readmission within the last 30 days:  No Current discharge risk:  Dependent with Mobility Barriers to Discharge:  Continued Medical Work up   [employer], LCSW 09/27/2017, 12:07 PM

## 2017-09-27 NOTE — Progress Notes (Signed)
PT Cancellation Note  Patient Details Name: Kelli Mason MRN: [id number] DOB: [birth year]   Cancelled Treatment:    Reason Eval/Treat Not Completed: Patient declined, no reason specified.  Pt reporting she has been up and down to the bathroom x 2 and doing her exercises.  She did not want to get up with me again this PM.  PT will check back in AM for continued mobility progression.   Thanks,    Barbarann Ehlers. , PT, DPT  Acute Rehabilitation [phone] pager #[phone] office   09/27/2017, 4:52 PM

## 2017-09-28 LAB — CBC
HEMATOCRIT: 29.2 % — AB (ref 36.0–46.0)
HEMOGLOBIN: 9.6 g/dL — AB (ref 12.0–15.0)
MCH: 31.5 pg (ref 26.0–34.0)
MCHC: 32.9 g/dL (ref 30.0–36.0)
MCV: 95.7 fL (ref 78.0–100.0)
Platelets: 143 10*3/uL — ABNORMAL LOW (ref 150–400)
RBC: 3.05 MIL/uL — ABNORMAL LOW (ref 3.87–5.11)
RDW: 18 % — AB (ref 11.5–15.5)
WBC: 12.7 10*3/uL — ABNORMAL HIGH (ref 4.0–10.5)

## 2017-09-28 LAB — GLUCOSE, CAPILLARY
GLUCOSE-CAPILLARY: 125 mg/dL — AB (ref 70–99)
Glucose-Capillary: 126 mg/dL — ABNORMAL HIGH (ref 70–99)
Glucose-Capillary: 108 mg/dL — ABNORMAL HIGH (ref 70–99)

## 2017-09-28 LAB — BASIC METABOLIC PANEL
Anion gap: 8 (ref 5–15)
BUN: 16 mg/dL (ref 8–23)
CHLORIDE: 106 mmol/L (ref 98–111)
CO2: 25 mmol/L (ref 22–32)
CREATININE: 0.89 mg/dL (ref 0.44–1.00)
Calcium: 9.2 mg/dL (ref 8.9–10.3)
GFR calc non Af Amer: >60 mL/min (ref >60)
GLUCOSE: 130 mg/dL — AB (ref 70–99)
Potassium: 4.1 mmol/L (ref 3.5–5.1)
Sodium: 139 mmol/L (ref 135–145)

## 2017-09-28 NOTE — Plan of Care (Signed)
  Problem: Education: Goal: Knowledge of General Education information will improve Description Including pain rating scale, medication(s)/side effects and non-pharmacologic comfort measures Outcome: Progressing   

## 2017-09-28 NOTE — Progress Notes (Signed)
Physical Therapy Treatment Patient Details Name: Kelli Mason MRN: [id number] DOB: 03-04-1950 Today's Date: 09/28/2017    History of Present Illness 67 y.o. female admitted on 09/27/17 for elective R posterior THA with hardware recomoval.  Pt is PWB 50% post op and had 2 units of PRBCs due to ABLA.  Pt with significant PMH of HTN, anemia, depression, bulging lumbar disc L4-5, anemia, R femur fx surgery (2008).     PT Comments    Continuing work on functional mobility and activity tolerance;  Good progress with amb, increasing distance this session; Good maintenance of 50%PWB with amb; Overall progressing well; Anticipate continuing good progress at post-acute rehabilitation.    Follow Up Recommendations  Supervision/Assistance - 24 hour;Follow surgeon's recommendation for DC plan and follow-up therapies;[facility])     Equipment Recommendations  None recommended by PT    Recommendations for Other Services       Precautions / Restrictions Precautions Precautions: Posterior Hip Precaution Comments: hip precaution handout given, precautions reviewed, PWB 50% explained.  Required Braces or Orthoses: Other Brace/Splint Other Brace/Splint: hip abduction pillow for in the bed.  Restrictions RLE Weight Bearing: Partial weight bearing RLE Partial Weight Bearing Percentage or Pounds: 50    Mobility  Bed Mobility Overal bed mobility: Needs Assistance Bed Mobility: Sit to Supine       Sit to supine: Min assist   General bed mobility comments: Min assist to help RLE into bed; close guard for precautions  Transfers Overall transfer level: Needs assistance Equipment used: Rolling walker (2 wheeled) Transfers: Sit to/from Stand Sit to Stand: Min guard         General transfer comment: Cues for hand placement, safety, and placement of R foot for better ease with transitions and better adherence to posterior precautions; better control of descent to  sit  Ambulation/Gait Ambulation/Gait assistance: Min guard Gait Distance (Feet): 65 Feet Assistive device: Rolling walker (2 wheeled) Gait Pattern/deviations: Step-to pattern;Antalgic(step-through pattern emerging)     General Gait Details: Improving gait pattern and good maintenance of 50%PWB   Stairs             Wheelchair Mobility    Modified Rankin (Stroke Patients Only)       Balance                                            Cognition Arousal/Alertness: Awake/alert Behavior During Therapy: WFL for tasks assessed/performed Overall Cognitive Status: Within Functional Limits for tasks assessed                                        Exercises      General Comments        Pertinent Vitals/Pain Pain Assessment: 0-10 Pain Score: 6  Pain Location: right leg Pain Descriptors / Indicators: Grimacing;Guarding Pain Intervention(s): Monitored during session    Home Living                      Prior Function            PT Goals (current goals can now be found in the care plan section) Acute Rehab PT Goals Patient Stated Goal: to go to rehab and get back home so she can get back to [place]. PT Goal  Formulation: With patient Time For Goal Achievement: 10/11/17 Potential to Achieve Goals: Good Progress towards PT goals: Progressing toward goals    Frequency    7X/week      PT Plan Current plan remains appropriate    Co-evaluation              AM-PAC PT "6 Clicks" Daily Activity  Outcome Measure  Difficulty turning over in bed (including adjusting bedclothes, sheets and blankets)?: Unable Difficulty moving from lying on back to sitting on the side of the bed? : A Lot Difficulty sitting down on and standing up from a chair with arms (e.g., wheelchair, bedside commode, etc,.)?: A Little Help needed moving to and from a bed to chair (including a wheelchair)?: A Little Help needed walking in  hospital room?: A Little Help needed climbing 3-5 steps with a railing? : A Lot 6 Click Score: 14    End of Session Equipment Utilized During Treatment: Gait belt Activity Tolerance: Patient tolerated treatment well Patient left: in bed;with call bell/phone within reach;with family/visitor present Nurse Communication: Mobility status PT Visit Diagnosis: Muscle weakness (generalized) (M62.81);Difficulty in walking, not elsewhere classified (R26.2);Pain Pain - Right/Left: Right Pain - part of body: Hip     Time: 8588-5027 PT Time Calculation (min) (ACUTE ONLY): 24 min  Charges:  $Gait Training: 8-22 mins $Therapeutic Activity: 8-22 mins                     Roney Marion, PT  Acute Rehabilitation Services Pager [phone] Office [place] 09/28/2017, 4:36 PM

## 2017-09-28 NOTE — Progress Notes (Addendum)
Subjective: 2 Days Post-Op Procedure(s) (LRB): RIGHT TOTAL HIP ARTHROPLASTY POSTERIOR WITH HARDWARE REMOVAL (Right) HARDWARE REMOVAL (Right) Patient reports pain as moderate.  Feeling much better this am. No lightheadedness/dizziness  Objective: Vital signs in last 24 hours: Temp:  [97.7 F (36.5 C)-99 F (37.2 C)] 97.7 F (36.5 C) (09/25 0422) Pulse Rate:  [72-89] 72 (09/25 0422) Resp:  [16-18] 16 (09/25 0422) BP: (105-134)/(54-80) 129/77 (09/25 0422) SpO2:  [92 %-99 %] 99 % (09/25 0422)  Intake/Output from previous day: 09/24 0701 - 09/25 0700 In: 110 [I.V.:110] Out: 350 [Drains:350] Intake/Output this shift: No intake/output data recorded.  Recent Labs    09/26/17 1855 09/27/17 0800  HGB 7.5* 9.3*   Recent Labs    09/26/17 1855 09/27/17 0800  WBC 10.0 6.7  RBC 2.20* 2.97*  HCT 23.3* 28.4*  PLT 135* 115*   Recent Labs    09/27/17 0549  NA 130*  K 3.6  CL 94*  CO2 22  BUN 14  CREATININE 1.35*  GLUCOSE 397*  CALCIUM 7.9*   No results for input(s): LABPT, INR in the last 72 hours.  Neurologically intact Neurovascular intact Sensation intact distally Intact pulses distally Dorsiflexion/Plantar flexion intact Incision: dressing C/D/I No cellulitis present Compartment soft    Assessment/Plan: 2 Days Post-Op Procedure(s) (LRB): RIGHT TOTAL HIP ARTHROPLASTY POSTERIOR WITH HARDWARE REMOVAL (Right) HARDWARE REMOVAL (Right) Advance diet Up with therapy Discharge to SNF once insurance approves and bed available 50% weight bearing RLE-posterior hip precautions ABLA- trended up to 9.5 following 2 units PRBC.  Asymptomatic this am. hemovac drain pulled by me today    Kelli Mason 09/28/2017, 7:26 AM

## 2017-09-28 NOTE — Progress Notes (Signed)
Physical Therapy Treatment Patient Details Name: Kelli Mason MRN: [id number] DOB: May 23, 1950 Today's Date: 09/28/2017    History of Present Illness 67 y.o. female admitted on 09/27/17 for elective R posterior THA with hardware recomoval.  Pt is PWB 50% post op and had 2 units of PRBCs due to ABLA.  Pt with significant PMH of HTN, anemia, depression, bulging lumbar disc L4-5, anemia, R femur fx surgery (2008).     PT Comments    Continuing work on functional mobility and activity tolerance;  Notable improvements in active hip movements, and especially gait distance/endurance; cues for hip prec, especially during transfers -- wil need reinforcement   Follow Up Recommendations  Supervision/Assistance - 24 hour;Follow surgeon's recommendation for DC plan and follow-up therapies;[facility]     Equipment Recommendations  None recommended by PT    Recommendations for Other Services       Precautions / Restrictions Precautions Precautions: Posterior Hip Precaution Comments: hip precaution handout given, precautions reviewed, PWB 50% explained.  Required Braces or Orthoses: Other Brace/Splint Other Brace/Splint: hip abduction pillow for in the bed.  Restrictions RLE Weight Bearing: Partial weight bearing RLE Partial Weight Bearing Percentage or Pounds: 50    Mobility  Bed Mobility                  Transfers Overall transfer level: Needs assistance Equipment used: Rolling walker (2 wheeled) Transfers: Sit to/from Stand Sit to Stand: Min guard         General transfer comment: Cues for hand placement, safety, and placement of R foot for better ease with transitions and better adherence to posterior precautions; better control of descent to sit  Ambulation/Gait Ambulation/Gait assistance: Min guard Gait Distance (Feet): 35 Feet Assistive device: Rolling walker (2 wheeled) Gait Pattern/deviations: Step-to pattern;Antalgic     General Gait Details:  No hopping noted this session; good R foot stepping and use of RW to [place] R foot in stance while advancing L LE; midway through walk, noted more drainage from hip dressing; walked back to recliner and notified RN   Stairs             Wheelchair Mobility    Modified Rankin (Stroke Patients Only)       Balance                                            Cognition Arousal/Alertness: Awake/alert Behavior During Therapy: WFL for tasks assessed/performed Overall Cognitive Status: Within Functional Limits for tasks assessed                                        Exercises Total Joint Exercises Ankle Circles/Pumps: AROM;Both;20 reps Quad Sets: AROM;Right;10 reps Gluteal Sets: AROM;Both;10 reps Towel Squeeze: AROM;Both;10 reps Short Arc Quad: AROM;Right;10 reps Heel Slides: AAROM;Right;10 reps Hip ABduction/ADduction: AAROM;Right;10 reps    General Comments        Pertinent Vitals/Pain Pain Assessment: 0-10 Pain Score: 5  Pain Location: right leg Pain Descriptors / Indicators: Grimacing;Guarding Pain Intervention(s): Monitored during session    Home Living                      Prior Function            PT Goals (current goals  can now be found in the care plan section) Acute Rehab PT Goals Patient Stated Goal: to go to rehab and get back home so she can get back to [place]. PT Goal Formulation: With patient Time For Goal Achievement: 10/11/17 Potential to Achieve Goals: Good Progress towards PT goals: Progressing toward goals    Frequency    7X/week      PT Plan Current plan remains appropriate    Co-evaluation              AM-PAC PT "6 Clicks" Daily Activity  Outcome Measure  Difficulty turning over in bed (including adjusting bedclothes, sheets and blankets)?: Unable Difficulty moving from lying on back to sitting on the side of the bed? : Unable Difficulty sitting down on and standing up from  a chair with arms (e.g., wheelchair, bedside commode, etc,.)?: A Little Help needed moving to and from a bed to chair (including a wheelchair)?: A Little Help needed walking in hospital room?: A Little Help needed climbing 3-5 steps with a railing? : A Lot 6 Click Score: 13    End of Session Equipment Utilized During Treatment: Gait belt Activity Tolerance: Patient tolerated treatment well Patient left: in chair;with call bell/phone within reach Nurse Communication: Mobility status PT Visit Diagnosis: Muscle weakness (generalized) (M62.81);Difficulty in walking, not elsewhere classified (R26.2);Pain Pain - Right/Left: Right Pain - part of body: Hip     Time: 1950-9326 PT Time Calculation (min) (ACUTE ONLY): 35 min  Charges:  $Gait Training: 8-22 mins $Therapeutic Exercise: 8-22 mins                     Roney Marion, PT  Acute Rehabilitation Services Pager [phone] Office [place] 09/28/2017, 1:04 PM

## 2017-09-28 NOTE — Discharge Summary (Addendum)
Patient ID: Kelli Mason MRN: [id number] DOB/AGE: 1950-11-24 67 y.o.  Admit date: 09/26/2017 Discharge date: 09/29/2017  Admission Diagnoses:  Principal Problem:   Primary osteoarthritis of right hip Active Problems:   History of hip replacement   Painful orthopaedic hardware [facility])   Discharge Diagnoses:  Same  Past Medical History:  Diagnosis Date  . Anemia    yrs ago  . Anxiety   . Arthritis   . Bulging lumbar disc    L4-5  . Depression   . GERD (gastroesophageal reflux disease)    drinks bubbly water  . Hypertension   . Hypothyroidism     Surgeries: Procedure(s): RIGHT TOTAL HIP ARTHROPLASTY POSTERIOR WITH HARDWARE REMOVAL HARDWARE REMOVAL on 09/26/2017   Consultants:   Discharged Condition: Improved  Hospital Course: Kelli Mason is an 67 y.o. female who was admitted 09/26/2017 for operative treatment ofPrimary osteoarthritis of right hip. Patient has severe unremitting pain that affects sleep, daily activities, and work/hobbies. After pre-op clearance the patient was taken to the operating room on 09/26/2017 and underwent  Procedure(s): RIGHT TOTAL HIP ARTHROPLASTY POSTERIOR WITH HARDWARE REMOVAL HARDWARE REMOVAL.    Patient was given perioperative antibiotics:  Anti-infectives (From admission, onward)   Start     Dose/Rate Route Frequency Ordered Stop   09/27/17 0000  ceFAZolin (ANCEF) IVPB 2g/100 mL premix     2 g 200 mL/hr over 30 Minutes Intravenous Every 6 hours 09/26/17 2355 09/27/17 1300   09/26/17 1101  vancomycin (VANCOCIN) powder  Status:  Discontinued       As needed 09/26/17 1101 09/26/17 1621   09/26/17 0938  ceFAZolin (ANCEF) 2-4 GM/100ML-% IVPB    Note to Pharmacy:  Tamsen Snider   : cabinet override      09/26/17 0938 09/26/17 2144   09/26/17 0000  doxycycline (VIBRA-TABS) 100 MG tablet     100 mg Oral 2 times daily 09/26/17 1542         Patient was given sequential compression devices, early ambulation, and chemoprophylaxis to prevent  DVT.  Patient benefited maximally from hospital stay and there were no complications.    Recent vital signs:  Patient Vitals for the past 24 hrs:  BP Temp Temp src Pulse Resp SpO2  09/29/17 0350 105/68 98.1 F (36.7 C) Oral 71 14 100 %  09/28/17 2134 (!) 110/55 98.3 F (36.8 C) Oral 79 - 94 %  09/28/17 1443 (!) 110/54 - - 79 - 97 %  09/28/17 1355 (!) 99/58 - - 82 - -  09/28/17 1353 (!) 90/51 - - 80 - -  09/28/17 1352 (!) 89/53 98.9 F (37.2 C) Oral 79 17 97 %     Recent laboratory studies:  Recent Labs    09/27/17 0549 09/27/17 0800 09/28/17 0749  WBC  --  6.7 12.7*  HGB  --  9.3* 9.6*  HCT  --  28.4* 29.2*  PLT  --  115* 143*  NA 130*  --  139  K 3.6  --  4.1  CL 94*  --  106  CO2 22  --  25  BUN 14  --  16  CREATININE 1.35*  --  0.89  GLUCOSE 397*  --  130*  CALCIUM 7.9*  --  9.2     Discharge Medications:   Allergies as of 09/29/2017      Reactions   Sulfa Antibiotics    UNSPECIFIED REACTION    Keflex [cephalexin] Rash   About 30 yr ago, she had  it      Medication List    STOP taking these medications   tiZANidine 4 MG tablet Commonly known as:  ZANAFLEX     TAKE these medications   aspirin EC 81 MG tablet Take 1 tablet (81 mg total) by mouth 2 (two) times daily.   desvenlafaxine 100 MG 24 hr tablet Commonly known as:  PRISTIQ Take 100 mg by mouth daily.   doxycycline 100 MG tablet Commonly known as:  VIBRA-TABS Take 1 tablet (100 mg total) by mouth 2 (two) times daily.   hydrochlorothiazide 25 MG tablet Commonly known as:  HYDRODIURIL Take 25 mg by mouth daily.   levothyroxine 150 MCG tablet Commonly known as:  SYNTHROID, LEVOTHROID Take 150 mcg by mouth daily before breakfast.   methocarbamol 750 MG tablet Commonly known as:  ROBAXIN Take 1 tablet (750 mg total) by mouth 2 (two) times daily as needed for muscle spasms.   multivitamin with minerals Tabs tablet Take 1 tablet by mouth daily. Women's Multivitamin   olmesartan 40 MG  tablet Commonly known as:  BENICAR Take 40 mg by mouth daily.   ondansetron 4 MG tablet Commonly known as:  ZOFRAN Take 1-2 tablets (4-8 mg total) by mouth every 8 (eight) hours as needed for nausea or vomiting.   oxyCODONE 5 MG immediate release tablet Commonly known as:  Oxy IR/ROXICODONE Take 1-3 tablets (5-15 mg total) by mouth every 4 (four) hours as needed.   oxyCODONE 10 mg 12 hr tablet Commonly known as:  OXYCONTIN Take 1 tablet (10 mg total) by mouth every 12 (twelve) hours for 3 days.   promethazine 25 MG tablet Commonly known as:  PHENERGAN Take 1 tablet (25 mg total) by mouth every 6 (six) hours as needed for nausea.   senna-docusate 8.6-50 MG tablet Commonly known as:  Senokot-S Take 1 tablet by mouth at bedtime as needed.            Durable Medical Equipment  (From admission, onward)         Start     Ordered   09/26/17 2356  DME Walker rolling  Once    Question:  Patient needs a walker to treat with the following condition  Answer:  History of hip replacement   09/26/17 2355   09/26/17 2356  DME 3 n 1  Once     09/26/17 2355   09/26/17 2356  DME Bedside commode  Once    Question:  Patient needs a bedside commode to treat with the following condition  Answer:  History of hip replacement   09/26/17 2355          Diagnostic Studies: Dg Chest 2 View  Result Date: 09/15/2017 CLINICAL DATA:  Preop chest x-ray for RIGHT hip replacement. EXAM: CHEST - 2 VIEW COMPARISON:  None. FINDINGS: Heart size is within normal limits. Lungs are clear. Dextroscoliosis of the thoracolumbar spine. No acute or suspicious osseous finding. IMPRESSION: No active cardiopulmonary disease. Electronically Signed   By: Franki Cabot M.D.   On: 09/15/2017 15:35   Dg Pelvis Portable  Result Date: 09/26/2017 CLINICAL DATA:  Postop. EXAM: PORTABLE PELVIS 1-2 VIEWS COMPARISON:  07/27/2017 FINDINGS: Examination demonstrates placement of a right total hip arthroplasty intact and normally  located. Cerclage wire over the trochanteric segment of the femoral prosthesis. Degenerative changes of the left hip. IMPRESSION: Interval placement of right total hip arthroplasty intact. Electronically Signed   By: Marin Olp M.D.   On: 09/26/2017 17:10  Disposition: Discharge disposition: 03-Skilled Nursing Facility          Contact information for follow-up providers    Leandrew Koyanagi, MD In 2 weeks.   Specialty:  Orthopedic Surgery Why:  For suture removal, For wound re-check Contact information: [place] [id number] [phone]            Contact information for after-discharge care    Destination    [facility] Preferred SNF .   Service:  Skilled Nursing Contact information: [address] [place] [phone]                   Signed: Aundra Dubin 09/29/2017, 10:08 AM

## 2017-09-29 DIAGNOSIS — Z7982 Long term (current) use of aspirin: Secondary | ICD-10-CM | POA: Diagnosis not present

## 2017-09-29 DIAGNOSIS — F329 Major depressive disorder, single episode, unspecified: Secondary | ICD-10-CM | POA: Diagnosis not present

## 2017-09-29 DIAGNOSIS — R278 Other lack of coordination: Secondary | ICD-10-CM | POA: Diagnosis not present

## 2017-09-29 DIAGNOSIS — M6281 Muscle weakness (generalized): Secondary | ICD-10-CM | POA: Diagnosis not present

## 2017-09-29 DIAGNOSIS — Z471 Aftercare following joint replacement surgery: Secondary | ICD-10-CM | POA: Diagnosis not present

## 2017-09-29 DIAGNOSIS — R2681 Unsteadiness on feet: Secondary | ICD-10-CM | POA: Diagnosis not present

## 2017-09-29 DIAGNOSIS — S72001D Fracture of unspecified part of neck of right femur, subsequent encounter for closed fracture with routine healing: Secondary | ICD-10-CM | POA: Diagnosis not present

## 2017-09-29 DIAGNOSIS — I10 Essential (primary) hypertension: Secondary | ICD-10-CM | POA: Diagnosis not present

## 2017-09-29 DIAGNOSIS — K219 Gastro-esophageal reflux disease without esophagitis: Secondary | ICD-10-CM | POA: Diagnosis not present

## 2017-09-29 DIAGNOSIS — K5900 Constipation, unspecified: Secondary | ICD-10-CM | POA: Diagnosis not present

## 2017-09-29 DIAGNOSIS — E039 Hypothyroidism, unspecified: Secondary | ICD-10-CM | POA: Diagnosis not present

## 2017-09-29 DIAGNOSIS — F419 Anxiety disorder, unspecified: Secondary | ICD-10-CM | POA: Diagnosis not present

## 2017-09-29 DIAGNOSIS — D649 Anemia, unspecified: Secondary | ICD-10-CM | POA: Diagnosis not present

## 2017-09-29 DIAGNOSIS — R41841 Cognitive communication deficit: Secondary | ICD-10-CM | POA: Diagnosis not present

## 2017-09-29 DIAGNOSIS — Z96641 Presence of right artificial hip joint: Secondary | ICD-10-CM | POA: Diagnosis not present

## 2017-09-29 LAB — GLUCOSE, CAPILLARY
GLUCOSE-CAPILLARY: 93 mg/dL (ref 70–99)
Glucose-Capillary: 83 mg/dL (ref 70–99)

## 2017-09-29 NOTE — Progress Notes (Signed)
Patient will DC to: [facility] Anticipated DC date: 09/29/17 Family notified: None at pt request Transport by: family member picking pt up before noon today.  Per MD patient ready for DC to [facility] . RN, patient, patient's family, and facility notified of DC. Discharge Summary sent to facility. RN given number for report [phone] Room 35.  CSW signing off.  [place], [place]

## 2017-09-29 NOTE — Progress Notes (Signed)
Subjective: 3 Days Post-Op Procedure(s) (LRB): RIGHT TOTAL HIP ARTHROPLASTY POSTERIOR WITH HARDWARE REMOVAL (Right) HARDWARE REMOVAL (Right) Patient reports pain as mild.  No nausea/vomiting, lightheadedness/dizziness.    Objective: Vital signs in last 24 hours: Temp:  [98.1 F (36.7 C)-98.9 F (37.2 C)] 98.1 F (36.7 C) (09/26 0350) Pulse Rate:  [71-83] 71 (09/26 0350) Resp:  [14-17] 14 (09/26 0350) BP: (89-132)/(51-78) 105/68 (09/26 0350) SpO2:  [94 %-100 %] 100 % (09/26 0350)  Intake/Output from previous day: 09/25 0701 - 09/26 0700 In: 360 [P.O.:360] Out: -  Intake/Output this shift: No intake/output data recorded.  Recent Labs    09/26/17 1855 09/27/17 0800 09/28/17 0749  HGB 7.5* 9.3* 9.6*   Recent Labs    09/27/17 0800 09/28/17 0749  WBC 6.7 12.7*  RBC 2.97* 3.05*  HCT 28.4* 29.2*  PLT 115* 143*   Recent Labs    09/27/17 0549 09/28/17 0749  NA 130* 139  K 3.6 4.1  CL 94* 106  CO2 22 25  BUN 14 16  CREATININE 1.35* 0.89  GLUCOSE 397* 130*  CALCIUM 7.9* 9.2   No results for input(s): LABPT, INR in the last 72 hours.  Neurologically intact Neurovascular intact Sensation intact distally Intact pulses distally Dorsiflexion/Plantar flexion intact Incision: dressing C/D/I No cellulitis present Compartment soft     Assessment/Plan: 3 Days Post-Op Procedure(s) (LRB): RIGHT TOTAL HIP ARTHROPLASTY POSTERIOR WITH HARDWARE REMOVAL (Right) HARDWARE REMOVAL (Right) Advance diet Up with therapy Discharge to SNF today 50% weight bearing RLE-posterior hip precautions Continue hip abduction pillow ABLA- mild and stable    Kelli Mason 09/29/2017, 7:41 AM

## 2017-09-29 NOTE — Clinical Social Work Placement (Signed)
   CLINICAL SOCIAL WORK PLACEMENT  NOTE  Date:  09/29/2017  Patient Details  Name: Kelli Mason MRN: [id number] Date of Birth: [birth year]  Clinical Social Work is seeking post-discharge placement for this patient at the [place] level of care (*CSW will initial, date and re-position this form in  chart as items are completed):  Yes   Patient/family provided with [facility] Work Department's list of facilities offering this level of care within the geographic area requested by the patient (or if unable, by the patient's family).  Yes   Patient/family informed of their freedom to choose among providers that offer the needed level of care, that participate in Medicare, Medicaid or managed care program needed by the patient, have an available bed and are willing to accept the patient.      Patient/family informed of 's ownership interest in [facility] and [facility], as well as of the fact that they are under no obligation to receive care at these facilities.  PASRR submitted to EDS on       PASRR number received on 09/26/17     Existing PASRR number confirmed on       FL2 transmitted to all facilities in geographic area requested by pt/family on 09/26/17     FL2 transmitted to all facilities within larger geographic area on       Patient informed that his/her managed care company has contracts with or will negotiate with certain facilities, including the following:        Yes   Patient/family informed of bed offers received.  Patient chooses bed at [facility]     Physician recommends and patient chooses bed at [facility]    Patient to be transferred to [facility] on 09/29/17.  Patient to be transferred to facility by [facility]     Patient family notified on 09/29/17 of transfer.  Name of family member notified:  Patient identifies no family to be notified.     PHYSICIAN       Additional Comment:     _______________________________________________ Alberteen Sam, LCSW 09/29/2017, 8:25 AM

## 2017-09-29 NOTE — Care Management Important Message (Signed)
Important Message  Patient Details  Name: Kelli Mason MRN: [id number] Date of Birth: 09/30/1950   Medicare Important Message Given:  Yes    Orbie Pyo 09/29/2017, 2:33 PM

## 2017-09-30 DIAGNOSIS — D649 Anemia, unspecified: Secondary | ICD-10-CM | POA: Diagnosis not present

## 2017-09-30 DIAGNOSIS — I10 Essential (primary) hypertension: Secondary | ICD-10-CM | POA: Diagnosis not present

## 2017-09-30 DIAGNOSIS — S72001D Fracture of unspecified part of neck of right femur, subsequent encounter for closed fracture with routine healing: Secondary | ICD-10-CM | POA: Diagnosis not present

## 2017-09-30 DIAGNOSIS — F419 Anxiety disorder, unspecified: Secondary | ICD-10-CM | POA: Diagnosis not present

## 2017-09-30 LAB — TYPE AND SCREEN
ABO/RH(D): O POS
Antibody Screen: NEGATIVE
UNIT DIVISION: 0
UNIT DIVISION: 0
Unit division: 0

## 2017-09-30 LAB — BPAM RBC
Blood Product Expiration Date: 201910192359
Blood Product Expiration Date: 201910192359
Blood Product Expiration Date: 201910202359
ISSUE DATE / TIME: 201909232137
ISSUE DATE / TIME: 201909232336
UNIT TYPE AND RH: 5100
Unit Type and Rh: 5100
Unit Type and Rh: 5100

## 2017-10-04 DIAGNOSIS — F329 Major depressive disorder, single episode, unspecified: Secondary | ICD-10-CM | POA: Diagnosis not present

## 2017-10-04 DIAGNOSIS — R2681 Unsteadiness on feet: Secondary | ICD-10-CM | POA: Diagnosis not present

## 2017-10-04 DIAGNOSIS — R41841 Cognitive communication deficit: Secondary | ICD-10-CM | POA: Diagnosis not present

## 2017-10-04 DIAGNOSIS — F419 Anxiety disorder, unspecified: Secondary | ICD-10-CM | POA: Diagnosis not present

## 2017-10-04 DIAGNOSIS — Z471 Aftercare following joint replacement surgery: Secondary | ICD-10-CM | POA: Diagnosis not present

## 2017-10-04 DIAGNOSIS — M6281 Muscle weakness (generalized): Secondary | ICD-10-CM | POA: Diagnosis not present

## 2017-10-04 DIAGNOSIS — K5900 Constipation, unspecified: Secondary | ICD-10-CM | POA: Diagnosis not present

## 2017-10-04 DIAGNOSIS — R278 Other lack of coordination: Secondary | ICD-10-CM | POA: Diagnosis not present

## 2017-10-04 DIAGNOSIS — S72001D Fracture of unspecified part of neck of right femur, subsequent encounter for closed fracture with routine healing: Secondary | ICD-10-CM | POA: Diagnosis not present

## 2017-10-04 DIAGNOSIS — M62838 Other muscle spasm: Secondary | ICD-10-CM | POA: Diagnosis not present

## 2017-10-04 DIAGNOSIS — K219 Gastro-esophageal reflux disease without esophagitis: Secondary | ICD-10-CM | POA: Diagnosis not present

## 2017-10-04 DIAGNOSIS — I10 Essential (primary) hypertension: Secondary | ICD-10-CM | POA: Diagnosis not present

## 2017-10-04 DIAGNOSIS — M1651 Unilateral post-traumatic osteoarthritis, right hip: Secondary | ICD-10-CM | POA: Diagnosis not present

## 2017-10-04 DIAGNOSIS — D649 Anemia, unspecified: Secondary | ICD-10-CM | POA: Diagnosis not present

## 2017-10-04 DIAGNOSIS — Z96641 Presence of right artificial hip joint: Secondary | ICD-10-CM | POA: Diagnosis not present

## 2017-10-04 DIAGNOSIS — E039 Hypothyroidism, unspecified: Secondary | ICD-10-CM | POA: Diagnosis not present

## 2017-10-04 DIAGNOSIS — Z7982 Long term (current) use of aspirin: Secondary | ICD-10-CM | POA: Diagnosis not present

## 2017-10-11 ENCOUNTER — Ambulatory Visit (INDEPENDENT_AMBULATORY_CARE_PROVIDER_SITE_OTHER): Payer: Medicare Other | Admitting: Orthopaedic Surgery

## 2017-10-11 ENCOUNTER — Encounter (INDEPENDENT_AMBULATORY_CARE_PROVIDER_SITE_OTHER): Payer: Self-pay | Admitting: Orthopaedic Surgery

## 2017-10-11 ENCOUNTER — Ambulatory Visit (INDEPENDENT_AMBULATORY_CARE_PROVIDER_SITE_OTHER): Payer: Medicare Other

## 2017-10-11 VITALS — Ht 62.5 in | Wt 212.0 lb

## 2017-10-11 DIAGNOSIS — M1651 Unilateral post-traumatic osteoarthritis, right hip: Secondary | ICD-10-CM | POA: Diagnosis not present

## 2017-10-11 NOTE — Progress Notes (Signed)
   Post-Op Visit Note   Patient: Kelli Mason           Date of Birth: 1950/04/01           MRN: [id number] Visit Date: 10/11/2017 PCP: Celene Squibb, MD   Assessment & Plan:  Chief Complaint:  Chief Complaint  Patient presents with  . Right Hip - Routine Post Op   Visit Diagnoses:  1. Post-traumatic osteoarthritis of right hip     Plan: Adara is 2-week status post DHS removal and conversion to a right total hip replacement.  She is currently at [employer].  She reports no pain.  She complains of some muscle spasms.  She takes Tylenol occasionally.  She has been progressing with physical therapy.  She is very happy with her recovery.  Her surgical incision has fully healed without any evidence of infection.  Sutures were removed and Steri-Strips applied today.  Continue posterior hip precautions for a total of 3 months.  Continue with aspirin for DVT prophylaxis.  She may discontinue doxycycline.  Recheck in 4 weeks with 2 view x-rays of the pelvis and lateral hip.  At this point she may advance to weight-bear as tolerated.  Follow-Up Instructions: Return in about 4 weeks (around 11/08/2017).   Orders:  Orders Placed This Encounter  Procedures  . XR HIP UNILAT W OR W/O PELVIS 2-3 VIEWS RIGHT   No orders of the defined types were placed in this encounter.   Imaging: Xr Hip Unilat W Or W/o Pelvis 2-3 Views Right  Result Date: 10/11/2017 Stable right total hip replacement.  No complications.   PMFS History: Patient Active Problem List   Diagnosis Date Noted  . Primary osteoarthritis of right hip 09/26/2017  . History of hip replacement 09/26/2017  . Painful orthopaedic hardware [facility]) 09/26/2017   Past Medical History:  Diagnosis Date  . Anemia    yrs ago  . Anxiety   . Arthritis   . Bulging lumbar disc    L4-5  . Depression   . GERD (gastroesophageal reflux disease)    drinks bubbly water  . Hypertension   . Hypothyroidism     History reviewed. No  pertinent family history.  Past Surgical History:  Procedure Laterality Date  . broken foot     left  in the 70's  . FRACTURE SURGERY     right femur  . HARDWARE REMOVAL Right 09/26/2017   Procedure: HARDWARE REMOVAL;  Surgeon: Leandrew Koyanagi, MD;  Location: [place];  Service: Orthopedics;  Laterality: Right;  . miscarriage    . TOTAL HIP ARTHROPLASTY Right 09/26/2017   Procedure: RIGHT TOTAL HIP ARTHROPLASTY POSTERIOR WITH HARDWARE REMOVAL;  Surgeon: Leandrew Koyanagi, MD;  Location: [place];  Service: Orthopedics;  Laterality: Right;   Social History   Occupational History  . Not on file  Tobacco Use  . Smoking status: Never Smoker  . Smokeless tobacco: Never Used  Substance and Sexual Activity  . Alcohol use: Yes    Alcohol/week: 4.0 standard drinks    Types: 4 Glasses of wine per week    Comment: margarita with [place] food  . Drug use: Not Currently  . Sexual activity: Not on file

## 2017-10-12 DIAGNOSIS — D649 Anemia, unspecified: Secondary | ICD-10-CM | POA: Diagnosis not present

## 2017-10-12 DIAGNOSIS — F419 Anxiety disorder, unspecified: Secondary | ICD-10-CM | POA: Diagnosis not present

## 2017-10-12 DIAGNOSIS — S72001D Fracture of unspecified part of neck of right femur, subsequent encounter for closed fracture with routine healing: Secondary | ICD-10-CM | POA: Diagnosis not present

## 2017-10-12 DIAGNOSIS — I10 Essential (primary) hypertension: Secondary | ICD-10-CM | POA: Diagnosis not present

## 2017-10-17 DIAGNOSIS — R41841 Cognitive communication deficit: Secondary | ICD-10-CM | POA: Diagnosis not present

## 2017-10-17 DIAGNOSIS — I10 Essential (primary) hypertension: Secondary | ICD-10-CM | POA: Diagnosis not present

## 2017-10-17 DIAGNOSIS — F329 Major depressive disorder, single episode, unspecified: Secondary | ICD-10-CM | POA: Diagnosis not present

## 2017-10-17 DIAGNOSIS — D649 Anemia, unspecified: Secondary | ICD-10-CM | POA: Diagnosis not present

## 2017-10-17 DIAGNOSIS — K219 Gastro-esophageal reflux disease without esophagitis: Secondary | ICD-10-CM | POA: Diagnosis not present

## 2017-10-17 DIAGNOSIS — Z9181 History of falling: Secondary | ICD-10-CM | POA: Diagnosis not present

## 2017-10-17 DIAGNOSIS — F419 Anxiety disorder, unspecified: Secondary | ICD-10-CM | POA: Diagnosis not present

## 2017-10-17 DIAGNOSIS — Z8781 Personal history of (healed) traumatic fracture: Secondary | ICD-10-CM | POA: Diagnosis not present

## 2017-10-17 DIAGNOSIS — Z96641 Presence of right artificial hip joint: Secondary | ICD-10-CM | POA: Diagnosis not present

## 2017-10-17 DIAGNOSIS — T8484XD Pain due to internal orthopedic prosthetic devices, implants and grafts, subsequent encounter: Secondary | ICD-10-CM | POA: Diagnosis not present

## 2017-10-17 DIAGNOSIS — E039 Hypothyroidism, unspecified: Secondary | ICD-10-CM | POA: Diagnosis not present

## 2017-10-18 ENCOUNTER — Telehealth (INDEPENDENT_AMBULATORY_CARE_PROVIDER_SITE_OTHER): Payer: Self-pay | Admitting: Orthopaedic Surgery

## 2017-10-18 DIAGNOSIS — E039 Hypothyroidism, unspecified: Secondary | ICD-10-CM | POA: Diagnosis not present

## 2017-10-18 DIAGNOSIS — I10 Essential (primary) hypertension: Secondary | ICD-10-CM | POA: Diagnosis not present

## 2017-10-18 DIAGNOSIS — T8484XD Pain due to internal orthopedic prosthetic devices, implants and grafts, subsequent encounter: Secondary | ICD-10-CM | POA: Diagnosis not present

## 2017-10-18 DIAGNOSIS — F419 Anxiety disorder, unspecified: Secondary | ICD-10-CM | POA: Diagnosis not present

## 2017-10-18 DIAGNOSIS — F329 Major depressive disorder, single episode, unspecified: Secondary | ICD-10-CM | POA: Diagnosis not present

## 2017-10-18 NOTE — Telephone Encounter (Signed)
yes

## 2017-10-18 NOTE — Telephone Encounter (Signed)
[facility]  ([place]    Verbal orders Twice a week for 4 weeks

## 2017-10-18 NOTE — Telephone Encounter (Signed)
Okay on orders. 

## 2017-10-18 NOTE — Telephone Encounter (Signed)
Called and left VM with details.

## 2017-10-20 ENCOUNTER — Telehealth (INDEPENDENT_AMBULATORY_CARE_PROVIDER_SITE_OTHER): Payer: Self-pay | Admitting: Orthopaedic Surgery

## 2017-10-20 DIAGNOSIS — F419 Anxiety disorder, unspecified: Secondary | ICD-10-CM | POA: Diagnosis not present

## 2017-10-20 DIAGNOSIS — T8484XD Pain due to internal orthopedic prosthetic devices, implants and grafts, subsequent encounter: Secondary | ICD-10-CM | POA: Diagnosis not present

## 2017-10-20 DIAGNOSIS — I10 Essential (primary) hypertension: Secondary | ICD-10-CM | POA: Diagnosis not present

## 2017-10-20 DIAGNOSIS — F329 Major depressive disorder, single episode, unspecified: Secondary | ICD-10-CM | POA: Diagnosis not present

## 2017-10-20 DIAGNOSIS — E039 Hypothyroidism, unspecified: Secondary | ICD-10-CM | POA: Diagnosis not present

## 2017-10-20 NOTE — Telephone Encounter (Signed)
yes

## 2017-10-20 NOTE — Telephone Encounter (Signed)
Okay to approve on orders?

## 2017-10-20 NOTE — Telephone Encounter (Signed)
Called back and approved orders.

## 2017-10-20 NOTE — Telephone Encounter (Signed)
Mallory from [facility] called to requests VO for [facility] OT for 1x a week for 2 weeks.  CB#[phone].  Thank you.

## 2017-10-24 DIAGNOSIS — I10 Essential (primary) hypertension: Secondary | ICD-10-CM | POA: Diagnosis not present

## 2017-10-24 DIAGNOSIS — F329 Major depressive disorder, single episode, unspecified: Secondary | ICD-10-CM | POA: Diagnosis not present

## 2017-10-24 DIAGNOSIS — E039 Hypothyroidism, unspecified: Secondary | ICD-10-CM | POA: Diagnosis not present

## 2017-10-24 DIAGNOSIS — T8484XD Pain due to internal orthopedic prosthetic devices, implants and grafts, subsequent encounter: Secondary | ICD-10-CM | POA: Diagnosis not present

## 2017-10-24 DIAGNOSIS — F419 Anxiety disorder, unspecified: Secondary | ICD-10-CM | POA: Diagnosis not present

## 2017-10-25 DIAGNOSIS — F419 Anxiety disorder, unspecified: Secondary | ICD-10-CM | POA: Diagnosis not present

## 2017-10-25 DIAGNOSIS — I10 Essential (primary) hypertension: Secondary | ICD-10-CM | POA: Diagnosis not present

## 2017-10-25 DIAGNOSIS — F329 Major depressive disorder, single episode, unspecified: Secondary | ICD-10-CM | POA: Diagnosis not present

## 2017-10-25 DIAGNOSIS — T8484XD Pain due to internal orthopedic prosthetic devices, implants and grafts, subsequent encounter: Secondary | ICD-10-CM | POA: Diagnosis not present

## 2017-10-25 DIAGNOSIS — E039 Hypothyroidism, unspecified: Secondary | ICD-10-CM | POA: Diagnosis not present

## 2017-10-27 DIAGNOSIS — F419 Anxiety disorder, unspecified: Secondary | ICD-10-CM | POA: Diagnosis not present

## 2017-10-27 DIAGNOSIS — E039 Hypothyroidism, unspecified: Secondary | ICD-10-CM | POA: Diagnosis not present

## 2017-10-27 DIAGNOSIS — T8484XD Pain due to internal orthopedic prosthetic devices, implants and grafts, subsequent encounter: Secondary | ICD-10-CM | POA: Diagnosis not present

## 2017-10-27 DIAGNOSIS — F329 Major depressive disorder, single episode, unspecified: Secondary | ICD-10-CM | POA: Diagnosis not present

## 2017-10-27 DIAGNOSIS — I10 Essential (primary) hypertension: Secondary | ICD-10-CM | POA: Diagnosis not present

## 2017-10-31 DIAGNOSIS — E039 Hypothyroidism, unspecified: Secondary | ICD-10-CM | POA: Diagnosis not present

## 2017-10-31 DIAGNOSIS — F419 Anxiety disorder, unspecified: Secondary | ICD-10-CM | POA: Diagnosis not present

## 2017-10-31 DIAGNOSIS — I10 Essential (primary) hypertension: Secondary | ICD-10-CM | POA: Diagnosis not present

## 2017-10-31 DIAGNOSIS — F329 Major depressive disorder, single episode, unspecified: Secondary | ICD-10-CM | POA: Diagnosis not present

## 2017-10-31 DIAGNOSIS — T8484XD Pain due to internal orthopedic prosthetic devices, implants and grafts, subsequent encounter: Secondary | ICD-10-CM | POA: Diagnosis not present

## 2017-11-04 DIAGNOSIS — F329 Major depressive disorder, single episode, unspecified: Secondary | ICD-10-CM | POA: Diagnosis not present

## 2017-11-04 DIAGNOSIS — I10 Essential (primary) hypertension: Secondary | ICD-10-CM | POA: Diagnosis not present

## 2017-11-04 DIAGNOSIS — T8484XD Pain due to internal orthopedic prosthetic devices, implants and grafts, subsequent encounter: Secondary | ICD-10-CM | POA: Diagnosis not present

## 2017-11-04 DIAGNOSIS — F419 Anxiety disorder, unspecified: Secondary | ICD-10-CM | POA: Diagnosis not present

## 2017-11-04 DIAGNOSIS — E039 Hypothyroidism, unspecified: Secondary | ICD-10-CM | POA: Diagnosis not present

## 2017-11-07 DIAGNOSIS — F419 Anxiety disorder, unspecified: Secondary | ICD-10-CM | POA: Diagnosis not present

## 2017-11-07 DIAGNOSIS — E039 Hypothyroidism, unspecified: Secondary | ICD-10-CM | POA: Diagnosis not present

## 2017-11-07 DIAGNOSIS — T8484XD Pain due to internal orthopedic prosthetic devices, implants and grafts, subsequent encounter: Secondary | ICD-10-CM | POA: Diagnosis not present

## 2017-11-07 DIAGNOSIS — F329 Major depressive disorder, single episode, unspecified: Secondary | ICD-10-CM | POA: Diagnosis not present

## 2017-11-07 DIAGNOSIS — I10 Essential (primary) hypertension: Secondary | ICD-10-CM | POA: Diagnosis not present

## 2017-11-08 ENCOUNTER — Ambulatory Visit (INDEPENDENT_AMBULATORY_CARE_PROVIDER_SITE_OTHER): Payer: Medicare Other

## 2017-11-08 ENCOUNTER — Encounter (INDEPENDENT_AMBULATORY_CARE_PROVIDER_SITE_OTHER): Payer: Self-pay | Admitting: Orthopaedic Surgery

## 2017-11-08 ENCOUNTER — Ambulatory Visit (INDEPENDENT_AMBULATORY_CARE_PROVIDER_SITE_OTHER): Payer: Medicare Other | Admitting: Orthopaedic Surgery

## 2017-11-08 DIAGNOSIS — M1651 Unilateral post-traumatic osteoarthritis, right hip: Secondary | ICD-10-CM

## 2017-11-08 NOTE — Progress Notes (Signed)
   Post-Op Visit Note   Patient: Kelli Mason           Date of Birth: 01/19/1950           MRN: [id number] Visit Date: 11/08/2017 PCP: Celene Squibb, MD   Assessment & Plan:  Chief Complaint:  Chief Complaint  Patient presents with  . Right Hip - Routine Post Op   Visit Diagnoses:  1. Post-traumatic osteoarthritis of right hip     Plan: Patient is a pleasant 67 year old female who presents to our clinic today 43 days status post right posterior total hip replacement with hardware removal, date of surgery 09/26/2017.  She has been doing fairly well.  Minimal pain.  She has been working with home health PT where she is regaining more more strength on a daily basis.  Examination of her right hip reveals a well-healed surgical incision without evidence of infection or cellulitis.  Full hip flexion.  4 out of 5 strength with resistance.  Calf is soft and nontender.  She is neurovascular intact distally.  At this point, we will continue her home health physical therapy for another 2 weeks.  She will continue with her home exercise program as well.  She will follow-up with [place] in 6 weeks time for repeat evaluation and x-ray.  Follow-Up Instructions: Return in about 6 weeks (around 12/20/2017).   Orders:  Orders Placed This Encounter  Procedures  . XR HIP UNILAT W OR W/O PELVIS 2-3 VIEWS RIGHT   No orders of the defined types were placed in this encounter.   Imaging: Xr Hip Unilat W Or W/o Pelvis 2-3 Views Right  Result Date: 11/08/2017 X-rays demonstrate a well-seated prosthesis without evidence of subsidence.  No evidence of fracture   PMFS History: Patient Active Problem List   Diagnosis Date Noted  . Post-traumatic osteoarthritis of right hip 11/08/2017  . Primary osteoarthritis of right hip 09/26/2017  . History of hip replacement 09/26/2017  . Painful orthopaedic hardware [facility]) 09/26/2017   Past Medical History:  Diagnosis Date  . Anemia    yrs ago  . Anxiety   .  Arthritis   . Bulging lumbar disc    L4-5  . Depression   . GERD (gastroesophageal reflux disease)    drinks bubbly water  . Hypertension   . Hypothyroidism     History reviewed. No pertinent family history.  Past Surgical History:  Procedure Laterality Date  . broken foot     left  in the 70's  . FRACTURE SURGERY     right femur  . HARDWARE REMOVAL Right 09/26/2017   Procedure: HARDWARE REMOVAL;  Surgeon: Leandrew Koyanagi, MD;  Location: [place];  Service: Orthopedics;  Laterality: Right;  . miscarriage    . TOTAL HIP ARTHROPLASTY Right 09/26/2017   Procedure: RIGHT TOTAL HIP ARTHROPLASTY POSTERIOR WITH HARDWARE REMOVAL;  Surgeon: Leandrew Koyanagi, MD;  Location: [place];  Service: Orthopedics;  Laterality: Right;   Social History   Occupational History  . Not on file  Tobacco Use  . Smoking status: Never Smoker  . Smokeless tobacco: Never Used  Substance and Sexual Activity  . Alcohol use: Yes    Alcohol/week: 4.0 standard drinks    Types: 4 Glasses of wine per week    Comment: margarita with [place] food  . Drug use: Not Currently  . Sexual activity: Not on file

## 2017-11-10 DIAGNOSIS — I10 Essential (primary) hypertension: Secondary | ICD-10-CM | POA: Diagnosis not present

## 2017-11-10 DIAGNOSIS — F329 Major depressive disorder, single episode, unspecified: Secondary | ICD-10-CM | POA: Diagnosis not present

## 2017-11-10 DIAGNOSIS — E039 Hypothyroidism, unspecified: Secondary | ICD-10-CM | POA: Diagnosis not present

## 2017-11-10 DIAGNOSIS — F419 Anxiety disorder, unspecified: Secondary | ICD-10-CM | POA: Diagnosis not present

## 2017-11-10 DIAGNOSIS — T8484XD Pain due to internal orthopedic prosthetic devices, implants and grafts, subsequent encounter: Secondary | ICD-10-CM | POA: Diagnosis not present

## 2017-11-15 DIAGNOSIS — I10 Essential (primary) hypertension: Secondary | ICD-10-CM | POA: Diagnosis not present

## 2017-11-15 DIAGNOSIS — E039 Hypothyroidism, unspecified: Secondary | ICD-10-CM | POA: Diagnosis not present

## 2017-11-15 DIAGNOSIS — F419 Anxiety disorder, unspecified: Secondary | ICD-10-CM | POA: Diagnosis not present

## 2017-11-15 DIAGNOSIS — F329 Major depressive disorder, single episode, unspecified: Secondary | ICD-10-CM | POA: Diagnosis not present

## 2017-11-15 DIAGNOSIS — T8484XD Pain due to internal orthopedic prosthetic devices, implants and grafts, subsequent encounter: Secondary | ICD-10-CM | POA: Diagnosis not present

## 2017-11-17 DIAGNOSIS — T8484XD Pain due to internal orthopedic prosthetic devices, implants and grafts, subsequent encounter: Secondary | ICD-10-CM | POA: Diagnosis not present

## 2017-11-17 DIAGNOSIS — I10 Essential (primary) hypertension: Secondary | ICD-10-CM | POA: Diagnosis not present

## 2017-11-17 DIAGNOSIS — F329 Major depressive disorder, single episode, unspecified: Secondary | ICD-10-CM | POA: Diagnosis not present

## 2017-11-17 DIAGNOSIS — E039 Hypothyroidism, unspecified: Secondary | ICD-10-CM | POA: Diagnosis not present

## 2017-11-17 DIAGNOSIS — F419 Anxiety disorder, unspecified: Secondary | ICD-10-CM | POA: Diagnosis not present

## 2017-11-21 DIAGNOSIS — M5430 Sciatica, unspecified side: Secondary | ICD-10-CM | POA: Diagnosis not present

## 2017-11-21 DIAGNOSIS — I10 Essential (primary) hypertension: Secondary | ICD-10-CM | POA: Diagnosis not present

## 2017-11-21 DIAGNOSIS — E6609 Other obesity due to excess calories: Secondary | ICD-10-CM | POA: Diagnosis not present

## 2017-11-21 DIAGNOSIS — E669 Obesity, unspecified: Secondary | ICD-10-CM | POA: Diagnosis not present

## 2017-11-21 DIAGNOSIS — E039 Hypothyroidism, unspecified: Secondary | ICD-10-CM | POA: Diagnosis not present

## 2017-11-21 DIAGNOSIS — F341 Dysthymic disorder: Secondary | ICD-10-CM | POA: Diagnosis not present

## 2017-11-21 DIAGNOSIS — R7301 Impaired fasting glucose: Secondary | ICD-10-CM | POA: Diagnosis not present

## 2017-11-21 DIAGNOSIS — Z6837 Body mass index (BMI) 37.0-37.9, adult: Secondary | ICD-10-CM | POA: Diagnosis not present

## 2017-11-21 DIAGNOSIS — M8580 Other specified disorders of bone density and structure, unspecified site: Secondary | ICD-10-CM | POA: Diagnosis not present

## 2017-11-21 DIAGNOSIS — Z23 Encounter for immunization: Secondary | ICD-10-CM | POA: Diagnosis not present

## 2017-11-21 DIAGNOSIS — Z6831 Body mass index (BMI) 31.0-31.9, adult: Secondary | ICD-10-CM | POA: Diagnosis not present

## 2017-11-21 DIAGNOSIS — E782 Mixed hyperlipidemia: Secondary | ICD-10-CM | POA: Diagnosis not present

## 2017-11-22 DIAGNOSIS — I10 Essential (primary) hypertension: Secondary | ICD-10-CM | POA: Diagnosis not present

## 2017-11-22 DIAGNOSIS — F419 Anxiety disorder, unspecified: Secondary | ICD-10-CM | POA: Diagnosis not present

## 2017-11-22 DIAGNOSIS — E039 Hypothyroidism, unspecified: Secondary | ICD-10-CM | POA: Diagnosis not present

## 2017-11-22 DIAGNOSIS — F329 Major depressive disorder, single episode, unspecified: Secondary | ICD-10-CM | POA: Diagnosis not present

## 2017-11-22 DIAGNOSIS — T8484XD Pain due to internal orthopedic prosthetic devices, implants and grafts, subsequent encounter: Secondary | ICD-10-CM | POA: Diagnosis not present

## 2017-11-24 DIAGNOSIS — M5430 Sciatica, unspecified side: Secondary | ICD-10-CM | POA: Diagnosis not present

## 2017-11-24 DIAGNOSIS — I10 Essential (primary) hypertension: Secondary | ICD-10-CM | POA: Diagnosis not present

## 2017-11-24 DIAGNOSIS — D649 Anemia, unspecified: Secondary | ICD-10-CM | POA: Diagnosis not present

## 2017-11-24 DIAGNOSIS — E669 Obesity, unspecified: Secondary | ICD-10-CM | POA: Diagnosis not present

## 2017-11-24 DIAGNOSIS — Z96641 Presence of right artificial hip joint: Secondary | ICD-10-CM | POA: Diagnosis not present

## 2017-11-24 DIAGNOSIS — E782 Mixed hyperlipidemia: Secondary | ICD-10-CM | POA: Diagnosis not present

## 2017-11-24 DIAGNOSIS — M8580 Other specified disorders of bone density and structure, unspecified site: Secondary | ICD-10-CM | POA: Diagnosis not present

## 2017-11-24 DIAGNOSIS — R7301 Impaired fasting glucose: Secondary | ICD-10-CM | POA: Diagnosis not present

## 2017-11-24 DIAGNOSIS — E039 Hypothyroidism, unspecified: Secondary | ICD-10-CM | POA: Diagnosis not present

## 2017-11-25 DIAGNOSIS — I10 Essential (primary) hypertension: Secondary | ICD-10-CM | POA: Diagnosis not present

## 2017-11-25 DIAGNOSIS — T8484XD Pain due to internal orthopedic prosthetic devices, implants and grafts, subsequent encounter: Secondary | ICD-10-CM | POA: Diagnosis not present

## 2017-11-25 DIAGNOSIS — F329 Major depressive disorder, single episode, unspecified: Secondary | ICD-10-CM | POA: Diagnosis not present

## 2017-11-25 DIAGNOSIS — E039 Hypothyroidism, unspecified: Secondary | ICD-10-CM | POA: Diagnosis not present

## 2017-11-25 DIAGNOSIS — F419 Anxiety disorder, unspecified: Secondary | ICD-10-CM | POA: Diagnosis not present

## 2017-12-07 DIAGNOSIS — H2513 Age-related nuclear cataract, bilateral: Secondary | ICD-10-CM | POA: Diagnosis not present

## 2017-12-07 DIAGNOSIS — H43393 Other vitreous opacities, bilateral: Secondary | ICD-10-CM | POA: Diagnosis not present

## 2017-12-07 DIAGNOSIS — H43811 Vitreous degeneration, right eye: Secondary | ICD-10-CM | POA: Diagnosis not present

## 2017-12-07 DIAGNOSIS — H5213 Myopia, bilateral: Secondary | ICD-10-CM | POA: Diagnosis not present

## 2017-12-20 ENCOUNTER — Ambulatory Visit (INDEPENDENT_AMBULATORY_CARE_PROVIDER_SITE_OTHER): Payer: Medicare Other

## 2017-12-20 ENCOUNTER — Ambulatory Visit (INDEPENDENT_AMBULATORY_CARE_PROVIDER_SITE_OTHER): Payer: Medicare Other | Admitting: Orthopaedic Surgery

## 2017-12-20 ENCOUNTER — Encounter (INDEPENDENT_AMBULATORY_CARE_PROVIDER_SITE_OTHER): Payer: Self-pay | Admitting: Orthopaedic Surgery

## 2017-12-20 DIAGNOSIS — M25551 Pain in right hip: Secondary | ICD-10-CM

## 2017-12-20 DIAGNOSIS — M1651 Unilateral post-traumatic osteoarthritis, right hip: Secondary | ICD-10-CM

## 2017-12-20 DIAGNOSIS — Z96641 Presence of right artificial hip joint: Secondary | ICD-10-CM

## 2017-12-20 NOTE — Progress Notes (Signed)
   Post-Op Visit Note   Patient: Kelli Mason           Date of Birth: 12/30/1950           MRN: [id number] Visit Date: 12/20/2017 PCP: Celene Squibb, MD   Assessment & Plan:  Chief Complaint:  Chief Complaint  Patient presents with  . Right Hip - Routine Post Op   Visit Diagnoses:  1. History of right hip replacement   2. Pain in right hip   3. Post-traumatic osteoarthritis of right hip     Plan: Kelli Mason is almost 3 months status post DHS removal and conversion to a right total hip replacement.  She is overall doing well.  She denies any hip pain.  She has pretty much resumed her normal activity level.  Occasionally she will take some Tylenol for some discomfort.  She does not use any assistive devices on a regular basis.  Her surgical scar is fully healed.  Her leg lengths are equal.  She is doing very well and her x-rays demonstrate a stable right total hip replacement.  At this point I like to see her back in 3 months with repeat 2 view x-rays of the right hip.  Follow-Up Instructions: Return in about 3 months (around 03/21/2018).   Orders:  Orders Placed This Encounter  Procedures  . XR HIP UNILAT W OR W/O PELVIS 2-3 VIEWS RIGHT   No orders of the defined types were placed in this encounter.   Imaging: Xr Hip Unilat W Or W/o Pelvis 2-3 Views Right  Result Date: 12/20/2017 Stable right total hip replacement.  The holes to the bone appear to be filling in.   PMFS History: Patient Active Problem List   Diagnosis Date Noted  . Post-traumatic osteoarthritis of right hip 11/08/2017  . Primary osteoarthritis of right hip 09/26/2017  . History of hip replacement 09/26/2017  . Painful orthopaedic hardware [facility]) 09/26/2017   Past Medical History:  Diagnosis Date  . Anemia    yrs ago  . Anxiety   . Arthritis   . Bulging lumbar disc    L4-5  . Depression   . GERD (gastroesophageal reflux disease)    drinks bubbly water  . Hypertension   . Hypothyroidism       History reviewed. No pertinent family history.  Past Surgical History:  Procedure Laterality Date  . broken foot     left  in the 70's  . FRACTURE SURGERY     right femur  . HARDWARE REMOVAL Right 09/26/2017   Procedure: HARDWARE REMOVAL;  Surgeon: Leandrew Koyanagi, MD;  Location: [place];  Service: Orthopedics;  Laterality: Right;  . miscarriage    . TOTAL HIP ARTHROPLASTY Right 09/26/2017   Procedure: RIGHT TOTAL HIP ARTHROPLASTY POSTERIOR WITH HARDWARE REMOVAL;  Surgeon: Leandrew Koyanagi, MD;  Location: [place];  Service: Orthopedics;  Laterality: Right;   Social History   Occupational History  . Not on file  Tobacco Use  . Smoking status: Never Smoker  . Smokeless tobacco: Never Used  Substance and Sexual Activity  . Alcohol use: Yes    Alcohol/week: 4.0 standard drinks    Types: 4 Glasses of wine per week    Comment: margarita with [place] food  . Drug use: Not Currently  . Sexual activity: Not on file

## 2017-12-26 ENCOUNTER — Other Ambulatory Visit: Payer: Self-pay | Admitting: Obstetrics & Gynecology

## 2017-12-26 DIAGNOSIS — Z1231 Encounter for screening mammogram for malignant neoplasm of breast: Secondary | ICD-10-CM

## 2018-01-05 DIAGNOSIS — E039 Hypothyroidism, unspecified: Secondary | ICD-10-CM | POA: Diagnosis not present

## 2018-01-10 DIAGNOSIS — E039 Hypothyroidism, unspecified: Secondary | ICD-10-CM | POA: Diagnosis not present

## 2018-01-10 DIAGNOSIS — Z96641 Presence of right artificial hip joint: Secondary | ICD-10-CM | POA: Diagnosis not present

## 2018-01-10 DIAGNOSIS — I10 Essential (primary) hypertension: Secondary | ICD-10-CM | POA: Diagnosis not present

## 2018-01-10 DIAGNOSIS — G4700 Insomnia, unspecified: Secondary | ICD-10-CM | POA: Diagnosis not present

## 2018-01-11 ENCOUNTER — Other Ambulatory Visit (INDEPENDENT_AMBULATORY_CARE_PROVIDER_SITE_OTHER): Payer: Self-pay | Admitting: Physician Assistant

## 2018-01-11 ENCOUNTER — Telehealth (INDEPENDENT_AMBULATORY_CARE_PROVIDER_SITE_OTHER): Payer: Self-pay | Admitting: Physician Assistant

## 2018-01-11 MED ORDER — CLINDAMYCIN HCL 300 MG PO CAPS: ORAL_CAPSULE | ORAL | 1 refills | Status: DC

## 2018-01-11 NOTE — Telephone Encounter (Signed)
Can you call patient and let her know ok for massage and also provider her with a note?  Also, let her know that we recommend abx for dental work for two years following joint replacement.  I have called in meds to her pharmacy for this

## 2018-01-12 ENCOUNTER — Encounter (INDEPENDENT_AMBULATORY_CARE_PROVIDER_SITE_OTHER): Payer: Self-pay

## 2018-01-12 NOTE — Telephone Encounter (Signed)
Called patient no answer. LMOM with details. I emailed her the  note.

## 2018-02-03 DIAGNOSIS — L821 Other seborrheic keratosis: Secondary | ICD-10-CM | POA: Diagnosis not present

## 2018-02-03 DIAGNOSIS — D2261 Melanocytic nevi of right upper limb, including shoulder: Secondary | ICD-10-CM | POA: Diagnosis not present

## 2018-02-03 DIAGNOSIS — D1801 Hemangioma of skin and subcutaneous tissue: Secondary | ICD-10-CM | POA: Diagnosis not present

## 2018-02-03 DIAGNOSIS — D235 Other benign neoplasm of skin of trunk: Secondary | ICD-10-CM | POA: Diagnosis not present

## 2018-02-03 DIAGNOSIS — D225 Melanocytic nevi of trunk: Secondary | ICD-10-CM | POA: Diagnosis not present

## 2018-02-03 DIAGNOSIS — L570 Actinic keratosis: Secondary | ICD-10-CM | POA: Diagnosis not present

## 2018-02-03 DIAGNOSIS — D2361 Other benign neoplasm of skin of right upper limb, including shoulder: Secondary | ICD-10-CM | POA: Diagnosis not present

## 2018-02-03 DIAGNOSIS — D0461 Carcinoma in situ of skin of right upper limb, including shoulder: Secondary | ICD-10-CM | POA: Diagnosis not present

## 2018-02-10 ENCOUNTER — Ambulatory Visit: Payer: Medicare Other

## 2018-02-15 DIAGNOSIS — Z124 Encounter for screening for malignant neoplasm of cervix: Secondary | ICD-10-CM | POA: Diagnosis not present

## 2018-02-15 DIAGNOSIS — Z01419 Encounter for gynecological examination (general) (routine) without abnormal findings: Secondary | ICD-10-CM | POA: Diagnosis not present

## 2018-03-03 DIAGNOSIS — D0461 Carcinoma in situ of skin of right upper limb, including shoulder: Secondary | ICD-10-CM | POA: Diagnosis not present

## 2018-03-03 DIAGNOSIS — L905 Scar conditions and fibrosis of skin: Secondary | ICD-10-CM | POA: Diagnosis not present

## 2018-03-03 DIAGNOSIS — D235 Other benign neoplasm of skin of trunk: Secondary | ICD-10-CM | POA: Diagnosis not present

## 2018-03-15 ENCOUNTER — Ambulatory Visit
Admission: RE | Admit: 2018-03-15 | Discharge: 2018-03-15 | Disposition: A | Discharge status: Home / Self Care | Payer: Medicare Other | Source: Ambulatory Visit | Attending: Obstetrics & Gynecology | Admitting: Obstetrics & Gynecology

## 2018-03-15 ENCOUNTER — Other Ambulatory Visit: Payer: Self-pay

## 2018-03-15 DIAGNOSIS — Z1231 Encounter for screening mammogram for malignant neoplasm of breast: Secondary | ICD-10-CM

## 2018-03-21 ENCOUNTER — Encounter (INDEPENDENT_AMBULATORY_CARE_PROVIDER_SITE_OTHER): Payer: Self-pay | Admitting: Orthopaedic Surgery

## 2018-03-21 ENCOUNTER — Ambulatory Visit (INDEPENDENT_AMBULATORY_CARE_PROVIDER_SITE_OTHER): Payer: Medicare Other | Admitting: Orthopaedic Surgery

## 2018-03-21 ENCOUNTER — Ambulatory Visit (INDEPENDENT_AMBULATORY_CARE_PROVIDER_SITE_OTHER): Payer: Medicare Other

## 2018-03-21 ENCOUNTER — Other Ambulatory Visit: Payer: Self-pay

## 2018-03-21 DIAGNOSIS — Z96641 Presence of right artificial hip joint: Secondary | ICD-10-CM

## 2018-03-21 NOTE — Progress Notes (Signed)
   Post-Op Visit Note   Patient: Kelli Mason           Date of Birth: [birth year]           MRN: [id number] Visit Date: 03/21/2018 PCP: Celene Squibb, MD   Assessment & Plan:  Chief Complaint:  Chief Complaint  Patient presents with  . Right Hip - Pain   Visit Diagnoses:  1. Status post total hip replacement, right     Plan: Patient is a pleasant 68 year old female who presents our clinic today 6 months status post right posterior hip replacement with hardware removal, date of surgery 09/26/2017.  She has been doing very well.  Occasional soreness and lightening bolt type pain, but nothing that is too bothersome.  She has been working on her home exercise program.  She still notes slight weakness to the right lower extremity, but continues to improve on a daily basis.  Overall doing very well and very pleased.  Examination of her right hip reveals a fully healed incision.  She has full range of motion.  4 out of 5 strength with resisted hip flexion.  Calf is soft nontender.  She is neurovascularly intact distally.  At this point, she will continue to work on her home exercise program to build strength.  She will follow-up with [place] in 3 months time for repeat evaluation.  Follow-Up Instructions: Return in about 3 months (around 06/21/2018).   Orders:  Orders Placed This Encounter  Procedures  . XR HIP UNILAT W OR W/O PELVIS 2-3 VIEWS RIGHT   No orders of the defined types were placed in this encounter.   Imaging: Xr Hip Unilat W Or W/o Pelvis 2-3 Views Right  Result Date: 03/21/2018 X-rays demonstrate a well-seated prosthesis without complications.  Previous screw holes have filled in.   PMFS History: Patient Active Problem List   Diagnosis Date Noted  . Post-traumatic osteoarthritis of right hip 11/08/2017  . Primary osteoarthritis of right hip 09/26/2017  . Status post total hip replacement, right 09/26/2017  . Painful orthopaedic hardware [facility]) 09/26/2017   Past Medical  History:  Diagnosis Date  . Anemia    yrs ago  . Anxiety   . Arthritis   . Bulging lumbar disc    L4-5  . Depression   . GERD (gastroesophageal reflux disease)    drinks bubbly water  . Hypertension   . Hypothyroidism     History reviewed. No pertinent family history.  Past Surgical History:  Procedure Laterality Date  . broken foot     left  in the 70's  . FRACTURE SURGERY     right femur  . HARDWARE REMOVAL Right 09/26/2017   Procedure: HARDWARE REMOVAL;  Surgeon: Leandrew Koyanagi, MD;  Location: [place];  Service: Orthopedics;  Laterality: Right;  . miscarriage    . TOTAL HIP ARTHROPLASTY Right 09/26/2017   Procedure: RIGHT TOTAL HIP ARTHROPLASTY POSTERIOR WITH HARDWARE REMOVAL;  Surgeon: Leandrew Koyanagi, MD;  Location: [place];  Service: Orthopedics;  Laterality: Right;   Social History   Occupational History  . Not on file  Tobacco Use  . Smoking status: Never Smoker  . Smokeless tobacco: Never Used  Substance and Sexual Activity  . Alcohol use: Yes    Alcohol/week: 4.0 standard drinks    Types: 4 Glasses of wine per week    Comment: margarita with [place] food  . Drug use: Not Currently  . Sexual activity: Not on file

## 2018-05-18 DIAGNOSIS — E782 Mixed hyperlipidemia: Secondary | ICD-10-CM | POA: Diagnosis not present

## 2018-05-18 DIAGNOSIS — R7301 Impaired fasting glucose: Secondary | ICD-10-CM | POA: Diagnosis not present

## 2018-05-18 DIAGNOSIS — I10 Essential (primary) hypertension: Secondary | ICD-10-CM | POA: Diagnosis not present

## 2018-05-18 DIAGNOSIS — E039 Hypothyroidism, unspecified: Secondary | ICD-10-CM | POA: Diagnosis not present

## 2018-05-24 DIAGNOSIS — R7301 Impaired fasting glucose: Secondary | ICD-10-CM | POA: Diagnosis not present

## 2018-05-24 DIAGNOSIS — E039 Hypothyroidism, unspecified: Secondary | ICD-10-CM | POA: Diagnosis not present

## 2018-05-24 DIAGNOSIS — Z96641 Presence of right artificial hip joint: Secondary | ICD-10-CM | POA: Diagnosis not present

## 2018-05-24 DIAGNOSIS — I10 Essential (primary) hypertension: Secondary | ICD-10-CM | POA: Diagnosis not present

## 2018-05-24 DIAGNOSIS — E669 Obesity, unspecified: Secondary | ICD-10-CM | POA: Diagnosis not present

## 2018-05-24 DIAGNOSIS — M8580 Other specified disorders of bone density and structure, unspecified site: Secondary | ICD-10-CM | POA: Diagnosis not present

## 2018-05-24 DIAGNOSIS — E782 Mixed hyperlipidemia: Secondary | ICD-10-CM | POA: Diagnosis not present

## 2018-05-25 DIAGNOSIS — Z0000 Encounter for general adult medical examination without abnormal findings: Secondary | ICD-10-CM | POA: Diagnosis not present

## 2018-06-02 DIAGNOSIS — D229 Melanocytic nevi, unspecified: Secondary | ICD-10-CM | POA: Diagnosis not present

## 2018-06-22 ENCOUNTER — Ambulatory Visit (INDEPENDENT_AMBULATORY_CARE_PROVIDER_SITE_OTHER): Payer: Medicare Other | Admitting: Orthopaedic Surgery

## 2018-06-22 ENCOUNTER — Encounter: Payer: Self-pay | Admitting: Orthopaedic Surgery

## 2018-06-22 ENCOUNTER — Other Ambulatory Visit: Payer: Self-pay

## 2018-06-22 DIAGNOSIS — Z96641 Presence of right artificial hip joint: Secondary | ICD-10-CM

## 2018-06-22 NOTE — Progress Notes (Signed)
Patient ID: Kelli Mason, female   DOB: Jul 04, 1950, 68 y.o.   MRN: [id number]  Kelli Mason is 9 months status post right total hip replacement and removal of DHS.  She is doing well overall and reports no pain.  She feels that she is got a new lease on life.  She is still doing home exercises for strengthening.  She has no complaints.  Her surgical scar is fully healed.  Leg lengths are equal.  She is ambulating well without any assistive devices.  She has good range of motion of the hip without pain.  I am very happy with her recovery and how she is doing.  I would like to see her back in 3 months for her one-year visit with standing AP pelvis and lateral hip.

## 2018-09-22 ENCOUNTER — Ambulatory Visit (INDEPENDENT_AMBULATORY_CARE_PROVIDER_SITE_OTHER): Payer: Medicare Other

## 2018-09-22 ENCOUNTER — Ambulatory Visit (INDEPENDENT_AMBULATORY_CARE_PROVIDER_SITE_OTHER): Payer: Medicare Other | Admitting: Orthopaedic Surgery

## 2018-09-22 ENCOUNTER — Encounter: Payer: Self-pay | Admitting: Orthopaedic Surgery

## 2018-09-22 VITALS — Ht 62.5 in | Wt 212.0 lb

## 2018-09-22 DIAGNOSIS — Z96641 Presence of right artificial hip joint: Secondary | ICD-10-CM

## 2018-09-22 MED ORDER — CLINDAMYCIN HCL 300 MG PO CAPS: ORAL_CAPSULE | ORAL | 1 refills | Status: DC

## 2018-09-22 NOTE — Progress Notes (Signed)
   Post-Op Visit Note   Patient: Kelli Mason           Date of Birth: 05/19/1950           MRN: [id number] Visit Date: 09/22/2018 PCP: Celene Squibb, MD   Assessment & Plan:  Chief Complaint:  Chief Complaint  Patient presents with  . Right Hip - Follow-up, Pain   Visit Diagnoses:  1. Status post total hip replacement, right     Plan: Patient is a pleasant 68 year old female who presents our clinic today 1 year out right total hip replacement with hardware removal, date of surgery 09/26/2017.  She has been doing excellent.  No complaints.  Feeling great overall.  Examination right hip reveals full range of motion strength.  Negative logroll.  She is neurovascular intact distally.  X-rays reveal a well-seated prosthesis without complication.  She will continue to advance with activity as tolerated.  She will follow-up with [place] in 1 years time for AP pelvis lateral right hip x-rays.  Dental prophylaxis reinforced.  Call with concerns or questions.  Follow-Up Instructions: Return in about 1 year (around 09/22/2019).   Orders:  Orders Placed This Encounter  Procedures  . XR HIP UNILAT W OR W/O PELVIS 2-3 VIEWS RIGHT   No orders of the defined types were placed in this encounter.   Imaging: Xr Hip Unilat W Or W/o Pelvis 2-3 Views Right  Result Date: 09/22/2018 X-rays demonstrate a well-seated prosthesis without complication   PMFS History: Patient Active Problem List   Diagnosis Date Noted  . Post-traumatic osteoarthritis of right hip 11/08/2017  . Primary osteoarthritis of right hip 09/26/2017  . Status post total hip replacement, right 09/26/2017  . Painful orthopaedic hardware [facility]) 09/26/2017   Past Medical History:  Diagnosis Date  . Anemia    yrs ago  . Anxiety   . Arthritis   . Bulging lumbar disc    L4-5  . Depression   . GERD (gastroesophageal reflux disease)    drinks bubbly water  . Hypertension   . Hypothyroidism     History reviewed. No pertinent  family history.  Past Surgical History:  Procedure Laterality Date  . broken foot     left  in the 70's  . FRACTURE SURGERY     right femur  . HARDWARE REMOVAL Right 09/26/2017   Procedure: HARDWARE REMOVAL;  Surgeon: Leandrew Koyanagi, MD;  Location: [place];  Service: Orthopedics;  Laterality: Right;  . miscarriage    . TOTAL HIP ARTHROPLASTY Right 09/26/2017   Procedure: RIGHT TOTAL HIP ARTHROPLASTY POSTERIOR WITH HARDWARE REMOVAL;  Surgeon: Leandrew Koyanagi, MD;  Location: [place];  Service: Orthopedics;  Laterality: Right;   Social History   Occupational History  . Not on file  Tobacco Use  . Smoking status: Never Smoker  . Smokeless tobacco: Never Used  Substance and Sexual Activity  . Alcohol use: Yes    Alcohol/week: 4.0 standard drinks    Types: 4 Glasses of wine per week    Comment: margarita with [place] food  . Drug use: Not Currently  . Sexual activity: Not on file

## 2018-11-20 DIAGNOSIS — I10 Essential (primary) hypertension: Secondary | ICD-10-CM | POA: Diagnosis not present

## 2018-11-20 DIAGNOSIS — E039 Hypothyroidism, unspecified: Secondary | ICD-10-CM | POA: Diagnosis not present

## 2018-11-20 DIAGNOSIS — R7301 Impaired fasting glucose: Secondary | ICD-10-CM | POA: Diagnosis not present

## 2018-11-20 DIAGNOSIS — E782 Mixed hyperlipidemia: Secondary | ICD-10-CM | POA: Diagnosis not present

## 2018-11-23 DIAGNOSIS — Z96641 Presence of right artificial hip joint: Secondary | ICD-10-CM | POA: Diagnosis not present

## 2018-11-23 DIAGNOSIS — E039 Hypothyroidism, unspecified: Secondary | ICD-10-CM | POA: Diagnosis not present

## 2018-11-23 DIAGNOSIS — R7301 Impaired fasting glucose: Secondary | ICD-10-CM | POA: Diagnosis not present

## 2018-11-23 DIAGNOSIS — Z23 Encounter for immunization: Secondary | ICD-10-CM | POA: Diagnosis not present

## 2018-11-23 DIAGNOSIS — I10 Essential (primary) hypertension: Secondary | ICD-10-CM | POA: Diagnosis not present

## 2018-11-23 DIAGNOSIS — M8580 Other specified disorders of bone density and structure, unspecified site: Secondary | ICD-10-CM | POA: Diagnosis not present

## 2018-11-23 DIAGNOSIS — E785 Hyperlipidemia, unspecified: Secondary | ICD-10-CM | POA: Diagnosis not present

## 2018-11-23 DIAGNOSIS — R252 Cramp and spasm: Secondary | ICD-10-CM | POA: Diagnosis not present

## 2018-11-23 DIAGNOSIS — E782 Mixed hyperlipidemia: Secondary | ICD-10-CM | POA: Diagnosis not present

## 2018-11-23 DIAGNOSIS — Z0001 Encounter for general adult medical examination with abnormal findings: Secondary | ICD-10-CM | POA: Diagnosis not present

## 2018-11-23 DIAGNOSIS — E669 Obesity, unspecified: Secondary | ICD-10-CM | POA: Diagnosis not present

## 2019-05-21 DIAGNOSIS — E6609 Other obesity due to excess calories: Secondary | ICD-10-CM | POA: Diagnosis not present

## 2019-05-21 DIAGNOSIS — M1612 Unilateral primary osteoarthritis, left hip: Secondary | ICD-10-CM | POA: Diagnosis not present

## 2019-05-21 DIAGNOSIS — F341 Dysthymic disorder: Secondary | ICD-10-CM | POA: Diagnosis not present

## 2019-05-21 DIAGNOSIS — M5430 Sciatica, unspecified side: Secondary | ICD-10-CM | POA: Diagnosis not present

## 2019-05-21 DIAGNOSIS — I10 Essential (primary) hypertension: Secondary | ICD-10-CM | POA: Diagnosis not present

## 2019-05-21 DIAGNOSIS — R7301 Impaired fasting glucose: Secondary | ICD-10-CM | POA: Diagnosis not present

## 2019-05-21 DIAGNOSIS — R252 Cramp and spasm: Secondary | ICD-10-CM | POA: Diagnosis not present

## 2019-05-21 DIAGNOSIS — E785 Hyperlipidemia, unspecified: Secondary | ICD-10-CM | POA: Diagnosis not present

## 2019-05-21 DIAGNOSIS — M8580 Other specified disorders of bone density and structure, unspecified site: Secondary | ICD-10-CM | POA: Diagnosis not present

## 2019-05-21 DIAGNOSIS — E039 Hypothyroidism, unspecified: Secondary | ICD-10-CM | POA: Diagnosis not present

## 2019-05-21 DIAGNOSIS — E669 Obesity, unspecified: Secondary | ICD-10-CM | POA: Diagnosis not present

## 2019-05-21 DIAGNOSIS — E782 Mixed hyperlipidemia: Secondary | ICD-10-CM | POA: Diagnosis not present

## 2019-05-24 DIAGNOSIS — E785 Hyperlipidemia, unspecified: Secondary | ICD-10-CM | POA: Diagnosis not present

## 2019-05-24 DIAGNOSIS — M8580 Other specified disorders of bone density and structure, unspecified site: Secondary | ICD-10-CM | POA: Diagnosis not present

## 2019-05-24 DIAGNOSIS — E669 Obesity, unspecified: Secondary | ICD-10-CM | POA: Diagnosis not present

## 2019-05-24 DIAGNOSIS — E782 Mixed hyperlipidemia: Secondary | ICD-10-CM | POA: Diagnosis not present

## 2019-05-24 DIAGNOSIS — Z0001 Encounter for general adult medical examination with abnormal findings: Secondary | ICD-10-CM | POA: Diagnosis not present

## 2019-05-24 DIAGNOSIS — E039 Hypothyroidism, unspecified: Secondary | ICD-10-CM | POA: Diagnosis not present

## 2019-05-24 DIAGNOSIS — Z96641 Presence of right artificial hip joint: Secondary | ICD-10-CM | POA: Diagnosis not present

## 2019-05-24 DIAGNOSIS — R7301 Impaired fasting glucose: Secondary | ICD-10-CM | POA: Diagnosis not present

## 2019-05-24 DIAGNOSIS — R252 Cramp and spasm: Secondary | ICD-10-CM | POA: Diagnosis not present

## 2019-05-24 DIAGNOSIS — Z23 Encounter for immunization: Secondary | ICD-10-CM | POA: Diagnosis not present

## 2019-05-24 DIAGNOSIS — I10 Essential (primary) hypertension: Secondary | ICD-10-CM | POA: Diagnosis not present

## 2019-06-19 IMAGING — MG DIGITAL SCREENING BILATERAL MAMMOGRAM WITH TOMO AND CAD
8 series · 8 of 24 positions shown · non-contrast
Comparison: Previous exam(s).

CLINICAL DATA: Screening.

EXAM:
DIGITAL SCREENING BILATERAL MAMMOGRAM WITH TOMO AND CAD

[R CC synth-2D]
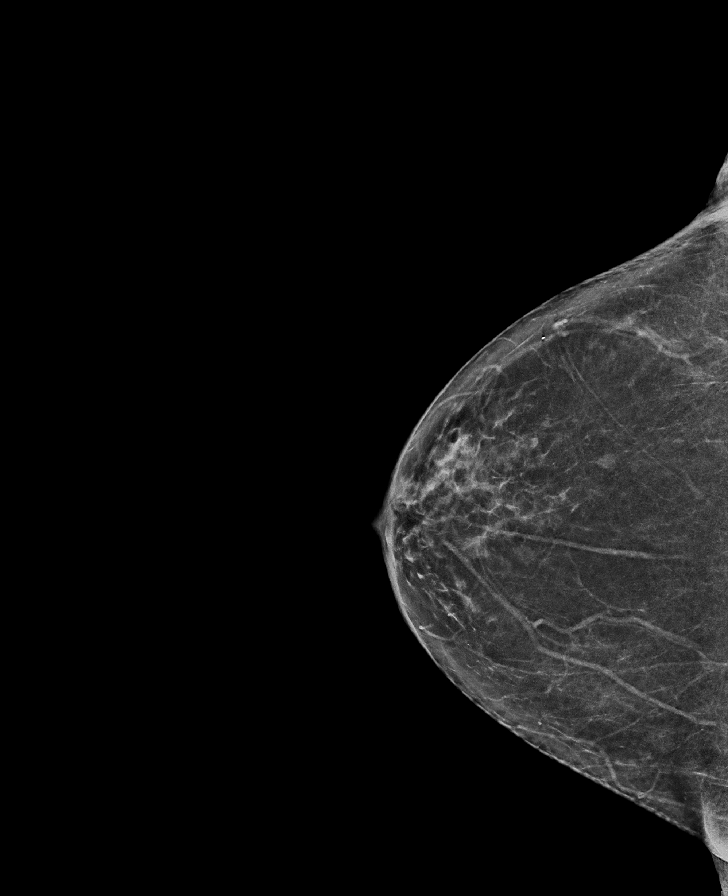

[R MLO synth-2D]
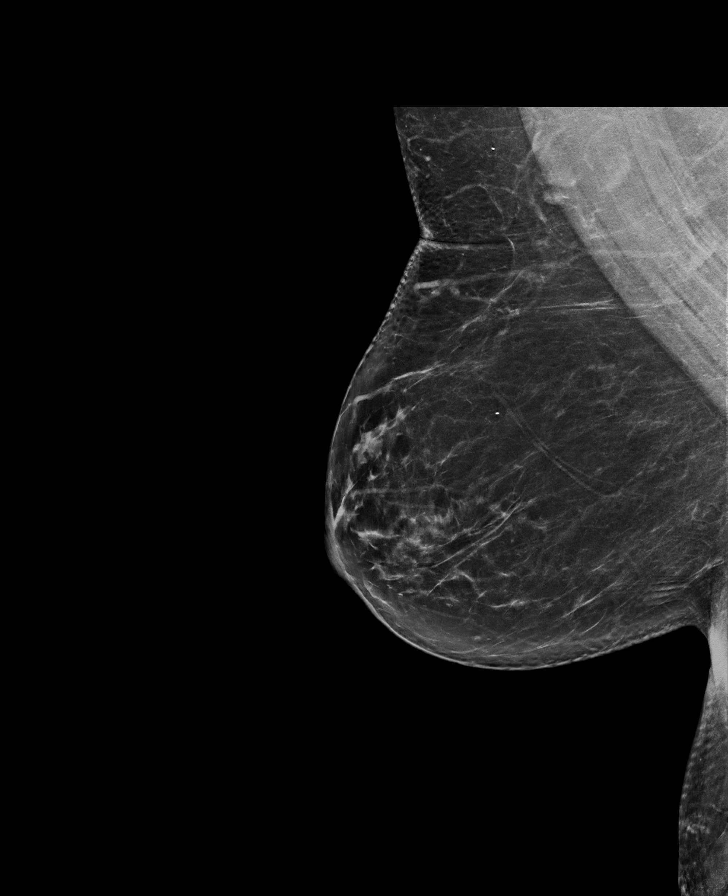

[L CC synth-2D]
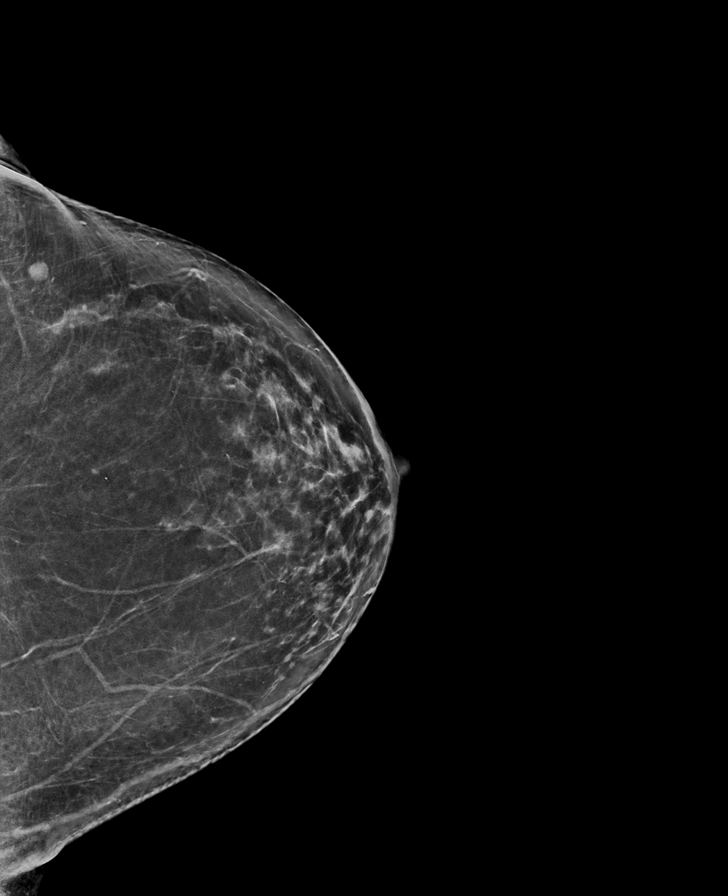

[L MLO synth-2D]
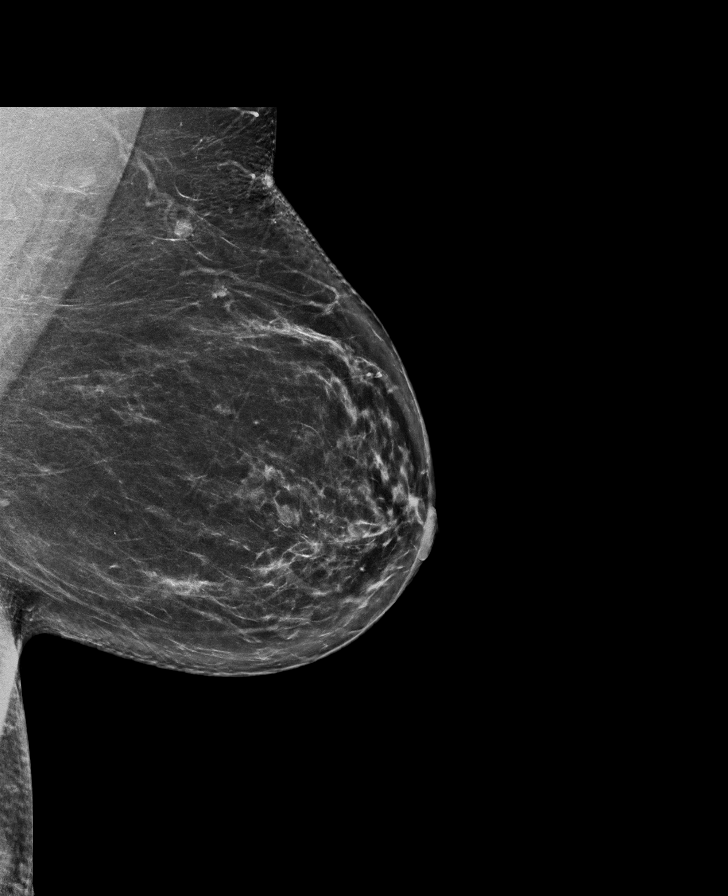

[R CC tomo · tomo slice 33/66.0]
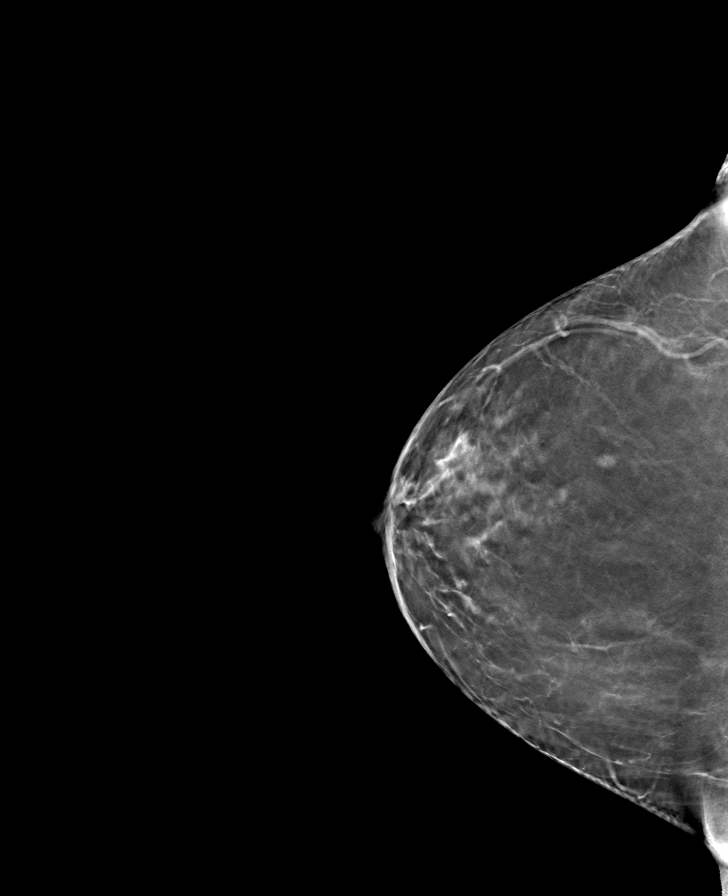

[L CC tomo · tomo slice 37/72.0]
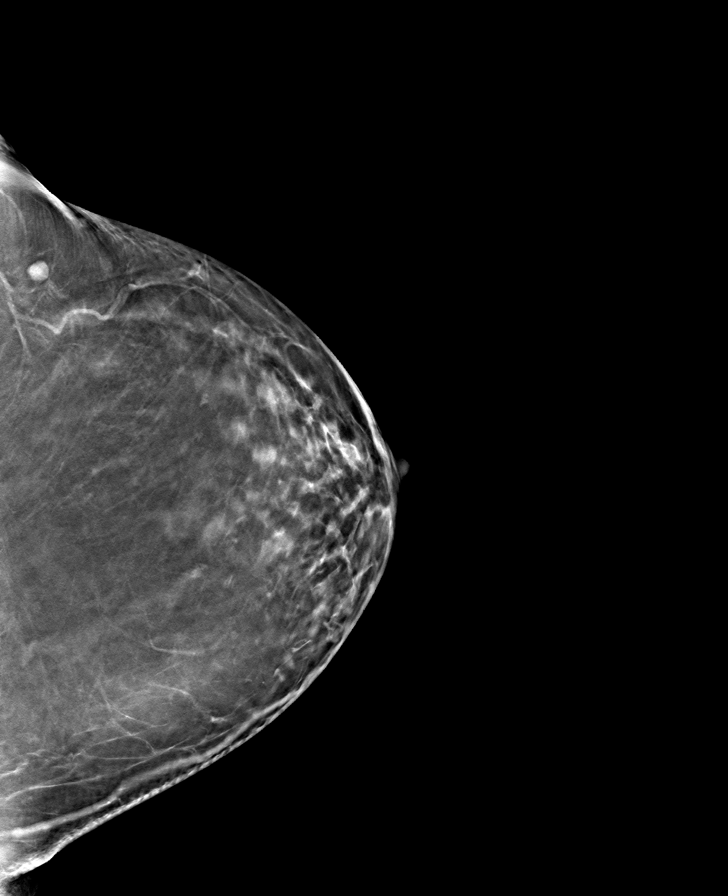

[R MLO tomo · tomo slice 41/80.0]
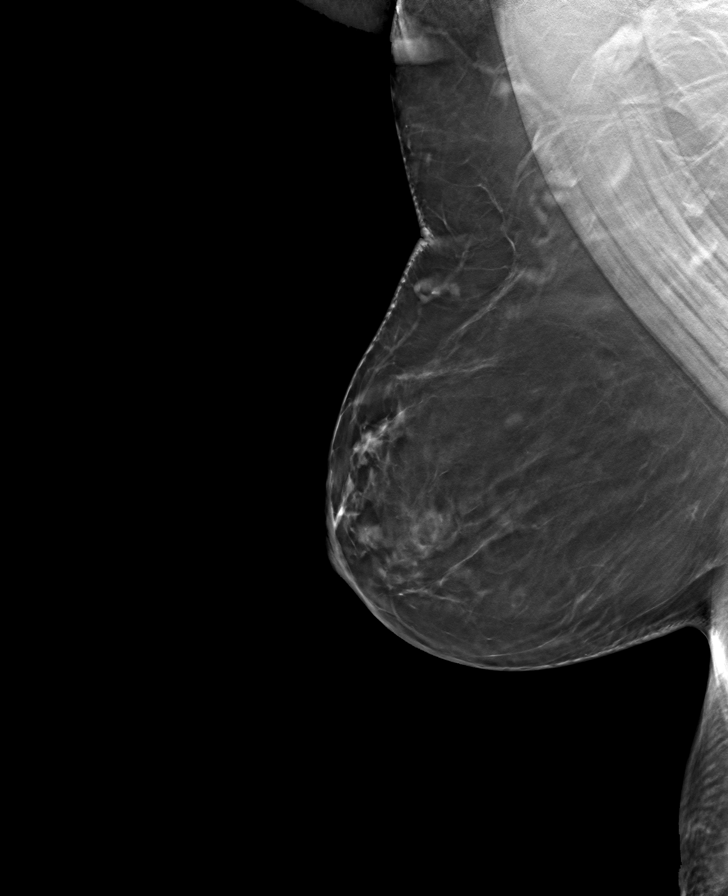

[L MLO tomo · tomo slice 40/79.0]
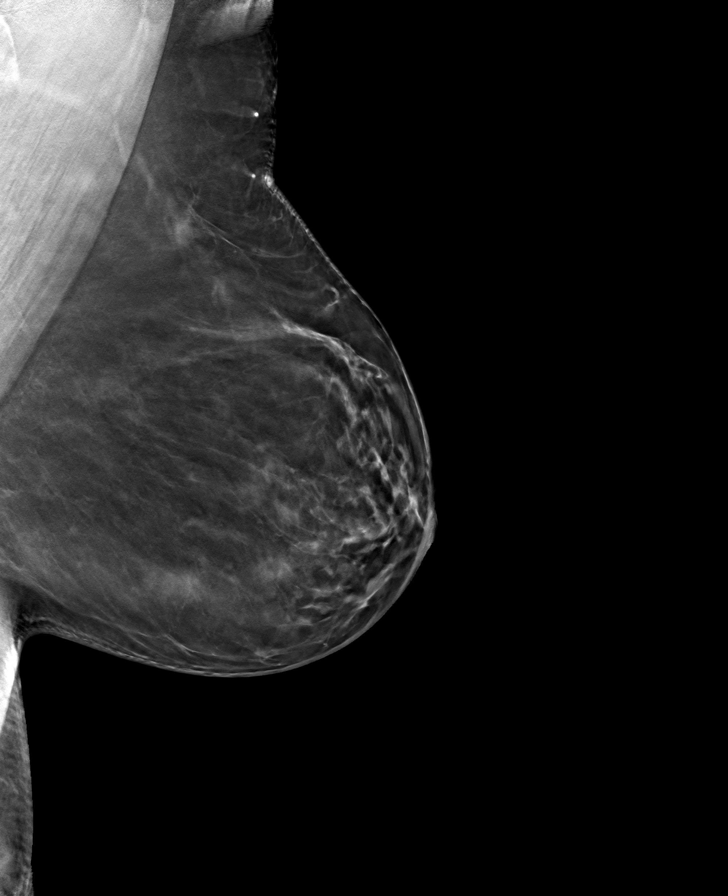

[8 of 24 positions shown; findings below may reference images not displayed]

ACR Breast Density Category b: There are scattered areas of
fibroglandular density.
FINDINGS: There are no findings suspicious for malignancy. Images were
processed with CAD.
IMPRESSION: No mammographic evidence of malignancy. A result letter of this
screening mammogram will be mailed directly to the patient.

RECOMMENDATION:
Screening mammogram in one year. (Code:[id number])

BI-RADS CATEGORY  1: Negative.

## 2019-08-15 ENCOUNTER — Ambulatory Visit: Payer: PPO | Admitting: Orthopaedic Surgery

## 2019-08-15 ENCOUNTER — Ambulatory Visit (INDEPENDENT_AMBULATORY_CARE_PROVIDER_SITE_OTHER): Payer: PPO

## 2019-08-15 ENCOUNTER — Encounter: Payer: Self-pay | Admitting: Orthopaedic Surgery

## 2019-08-15 DIAGNOSIS — Z96641 Presence of right artificial hip joint: Secondary | ICD-10-CM

## 2019-08-15 MED ORDER — METHYLPREDNISOLONE 4 MG PO TBPK: ORAL_TABLET | ORAL | 0 refills | Status: DC

## 2019-08-15 NOTE — Progress Notes (Signed)
Office Visit Note   Patient: Kelli Mason           Date of Birth: 1950/04/01           MRN: [id number] Visit Date: 08/15/2019              Requested by: Celene Squibb, MD [address] [place],   [id number] PCP: Celene Squibb, MD   Assessment & Plan: Visit Diagnoses:  1. Status post total hip replacement, right     Plan: Impression is right hip pain status post fall.  This is likely some sort of a stretch injury or contusion.  I recommended a Medrol Dosepak.  Overall she has done very well and requests to be released at this point and follow-up as needed.  I think this is appropriate from my standpoint.  Follow-up as needed.  Follow-Up Instructions: Return if symptoms worsen or fail to improve.   Orders:  Orders Placed This Encounter  Procedures  . XR HIP UNILAT W OR W/O PELVIS 2-3 VIEWS RIGHT   Meds ordered this encounter  Medications  . methylPREDNISolone (MEDROL DOSEPAK) 4 MG TBPK tablet    Sig: Use as directed    Dispense:  21 tablet    Refill:  0      Procedures: No procedures performed   Clinical Data: No additional findings.   Subjective: Chief Complaint  Patient presents with  . Right Hip - Pain    Kelli Mason is 2 years status post right total hip replacement with hardware removal.  She slipped out of bed about 3 weeks ago and sat down on her bottom.  She states that she has had some increased lateral hip pain.  She has still been able to walk without any difficulty.  No focal deficits by report.   Review of Systems  Constitutional: Negative.   HENT: Negative.   Eyes: Negative.   Respiratory: Negative.   Cardiovascular: Negative.   Endocrine: Negative.   Musculoskeletal: Negative.   Neurological: Negative.   Hematological: Negative.   Psychiatric/Behavioral: Negative.   All other systems reviewed and are negative.    Objective: Vital Signs: There were no vitals taken for this visit.  Physical Exam Vitals and nursing note reviewed.    Constitutional:      Appearance: She is well-developed.  Pulmonary:     Effort: Pulmonary effort is normal.  Skin:    General: Skin is warm.     Capillary Refill: Capillary refill takes less than 2 seconds.  Neurological:     Mental Status: She is alert and oriented to person, place, and time.  Psychiatric:        Behavior: Behavior normal.        Thought Content: Thought content normal.        Judgment: Judgment normal.     Ortho Exam Right hip shows a fully healed surgical scar.  There is no swelling or bruising.  Minimal tenderness to palpation over the lateral greater trochanter.  She is able to abduct her right leg against gravity without any difficulty or pain or weakness.  Passive range of motion of the hip does not elicit any pain. Specialty Comments:  No specialty comments available.  Imaging: XR HIP UNILAT W OR W/O PELVIS 2-3 VIEWS RIGHT  Result Date: 08/15/2019 Stable right total hip replacement without complication.    PMFS History: Patient Active Problem List   Diagnosis Date Noted  . Post-traumatic osteoarthritis of right hip 11/08/2017  .  Primary osteoarthritis of right hip 09/26/2017  . Status post total hip replacement, right 09/26/2017  . Painful orthopaedic hardware [facility]) 09/26/2017   Past Medical History:  Diagnosis Date  . Anemia    yrs ago  . Anxiety   . Arthritis   . Bulging lumbar disc    L4-5  . Depression   . GERD (gastroesophageal reflux disease)    drinks bubbly water  . Hypertension   . Hypothyroidism     History reviewed. No pertinent family history.  Past Surgical History:  Procedure Laterality Date  . broken foot     left  in the 70's  . FRACTURE SURGERY     right femur  . HARDWARE REMOVAL Right 09/26/2017   Procedure: HARDWARE REMOVAL;  Surgeon: Leandrew Koyanagi, MD;  Location: [place];  Service: Orthopedics;  Laterality: Right;  . miscarriage    . TOTAL HIP ARTHROPLASTY Right 09/26/2017   Procedure: RIGHT TOTAL HIP ARTHROPLASTY  POSTERIOR WITH HARDWARE REMOVAL;  Surgeon: Leandrew Koyanagi, MD;  Location: [place];  Service: Orthopedics;  Laterality: Right;   Social History   Occupational History  . Not on file  Tobacco Use  . Smoking status: Never Smoker  . Smokeless tobacco: Never Used  Vaping Use  . Vaping Use: Never used  Substance and Sexual Activity  . Alcohol use: Yes    Alcohol/week: 4.0 standard drinks    Types: 4 Glasses of wine per week    Comment: margarita with [place] food  . Drug use: Not Currently  . Sexual activity: Not on file

## 2019-11-28 DIAGNOSIS — E669 Obesity, unspecified: Secondary | ICD-10-CM | POA: Diagnosis not present

## 2019-11-28 DIAGNOSIS — R252 Cramp and spasm: Secondary | ICD-10-CM | POA: Diagnosis not present

## 2019-11-28 DIAGNOSIS — E785 Hyperlipidemia, unspecified: Secondary | ICD-10-CM | POA: Diagnosis not present

## 2019-11-28 DIAGNOSIS — M5430 Sciatica, unspecified side: Secondary | ICD-10-CM | POA: Diagnosis not present

## 2019-11-28 DIAGNOSIS — E782 Mixed hyperlipidemia: Secondary | ICD-10-CM | POA: Diagnosis not present

## 2019-11-28 DIAGNOSIS — Z6831 Body mass index (BMI) 31.0-31.9, adult: Secondary | ICD-10-CM | POA: Diagnosis not present

## 2019-11-28 DIAGNOSIS — Z0001 Encounter for general adult medical examination with abnormal findings: Secondary | ICD-10-CM | POA: Diagnosis not present

## 2019-11-28 DIAGNOSIS — Z23 Encounter for immunization: Secondary | ICD-10-CM | POA: Diagnosis not present

## 2019-11-28 DIAGNOSIS — M8580 Other specified disorders of bone density and structure, unspecified site: Secondary | ICD-10-CM | POA: Diagnosis not present

## 2019-11-28 DIAGNOSIS — R7301 Impaired fasting glucose: Secondary | ICD-10-CM | POA: Diagnosis not present

## 2019-11-28 DIAGNOSIS — E6609 Other obesity due to excess calories: Secondary | ICD-10-CM | POA: Diagnosis not present

## 2019-11-28 DIAGNOSIS — I10 Essential (primary) hypertension: Secondary | ICD-10-CM | POA: Diagnosis not present

## 2019-12-03 DIAGNOSIS — R252 Cramp and spasm: Secondary | ICD-10-CM | POA: Diagnosis not present

## 2019-12-03 DIAGNOSIS — E669 Obesity, unspecified: Secondary | ICD-10-CM | POA: Diagnosis not present

## 2019-12-03 DIAGNOSIS — I10 Essential (primary) hypertension: Secondary | ICD-10-CM | POA: Diagnosis not present

## 2019-12-03 DIAGNOSIS — R7301 Impaired fasting glucose: Secondary | ICD-10-CM | POA: Diagnosis not present

## 2019-12-03 DIAGNOSIS — E039 Hypothyroidism, unspecified: Secondary | ICD-10-CM | POA: Diagnosis not present

## 2019-12-03 DIAGNOSIS — E785 Hyperlipidemia, unspecified: Secondary | ICD-10-CM | POA: Diagnosis not present

## 2019-12-03 DIAGNOSIS — E782 Mixed hyperlipidemia: Secondary | ICD-10-CM | POA: Diagnosis not present

## 2019-12-03 DIAGNOSIS — Z96641 Presence of right artificial hip joint: Secondary | ICD-10-CM | POA: Diagnosis not present

## 2019-12-03 DIAGNOSIS — M8580 Other specified disorders of bone density and structure, unspecified site: Secondary | ICD-10-CM | POA: Diagnosis not present

## 2019-12-06 ENCOUNTER — Other Ambulatory Visit (HOSPITAL_COMMUNITY): Payer: Self-pay | Admitting: Internal Medicine

## 2019-12-06 DIAGNOSIS — Z1231 Encounter for screening mammogram for malignant neoplasm of breast: Secondary | ICD-10-CM

## 2019-12-06 DIAGNOSIS — Z13820 Encounter for screening for osteoporosis: Secondary | ICD-10-CM

## 2020-02-19 DIAGNOSIS — Z1151 Encounter for screening for human papillomavirus (HPV): Secondary | ICD-10-CM | POA: Diagnosis not present

## 2020-02-19 DIAGNOSIS — Z124 Encounter for screening for malignant neoplasm of cervix: Secondary | ICD-10-CM | POA: Diagnosis not present

## 2020-02-19 DIAGNOSIS — Z6839 Body mass index (BMI) 39.0-39.9, adult: Secondary | ICD-10-CM | POA: Diagnosis not present

## 2020-02-19 DIAGNOSIS — N3281 Overactive bladder: Secondary | ICD-10-CM | POA: Diagnosis not present

## 2020-02-19 DIAGNOSIS — Z01411 Encounter for gynecological examination (general) (routine) with abnormal findings: Secondary | ICD-10-CM | POA: Diagnosis not present

## 2020-02-19 DIAGNOSIS — Z01419 Encounter for gynecological examination (general) (routine) without abnormal findings: Secondary | ICD-10-CM | POA: Diagnosis not present

## 2020-02-19 DIAGNOSIS — L292 Pruritus vulvae: Secondary | ICD-10-CM | POA: Diagnosis not present

## 2020-04-02 DIAGNOSIS — R252 Cramp and spasm: Secondary | ICD-10-CM | POA: Diagnosis not present

## 2020-04-02 DIAGNOSIS — M8580 Other specified disorders of bone density and structure, unspecified site: Secondary | ICD-10-CM | POA: Diagnosis not present

## 2020-04-02 DIAGNOSIS — E782 Mixed hyperlipidemia: Secondary | ICD-10-CM | POA: Diagnosis not present

## 2020-04-02 DIAGNOSIS — Z96641 Presence of right artificial hip joint: Secondary | ICD-10-CM | POA: Diagnosis not present

## 2020-04-02 DIAGNOSIS — I10 Essential (primary) hypertension: Secondary | ICD-10-CM | POA: Diagnosis not present

## 2020-04-02 DIAGNOSIS — E039 Hypothyroidism, unspecified: Secondary | ICD-10-CM | POA: Diagnosis not present

## 2020-04-02 DIAGNOSIS — E785 Hyperlipidemia, unspecified: Secondary | ICD-10-CM | POA: Diagnosis not present

## 2020-04-02 DIAGNOSIS — E669 Obesity, unspecified: Secondary | ICD-10-CM | POA: Diagnosis not present

## 2020-04-23 ENCOUNTER — Ambulatory Visit: Payer: PPO | Admitting: Physician Assistant

## 2020-04-24 ENCOUNTER — Other Ambulatory Visit: Payer: PPO

## 2020-04-24 ENCOUNTER — Ambulatory Visit: Payer: PPO | Admitting: Physician Assistant

## 2020-04-24 ENCOUNTER — Ambulatory Visit
Admission: RE | Admit: 2020-04-24 | Discharge: 2020-04-24 | Disposition: A | Discharge status: Home / Self Care | Payer: PPO | Source: Ambulatory Visit | Attending: Internal Medicine | Admitting: Internal Medicine

## 2020-04-24 ENCOUNTER — Other Ambulatory Visit: Payer: Self-pay

## 2020-04-24 DIAGNOSIS — Z1231 Encounter for screening mammogram for malignant neoplasm of breast: Secondary | ICD-10-CM | POA: Diagnosis not present

## 2020-05-21 DIAGNOSIS — E039 Hypothyroidism, unspecified: Secondary | ICD-10-CM | POA: Diagnosis not present

## 2020-05-21 DIAGNOSIS — E785 Hyperlipidemia, unspecified: Secondary | ICD-10-CM | POA: Diagnosis not present

## 2020-05-21 DIAGNOSIS — I10 Essential (primary) hypertension: Secondary | ICD-10-CM | POA: Diagnosis not present

## 2020-05-21 DIAGNOSIS — R7301 Impaired fasting glucose: Secondary | ICD-10-CM | POA: Diagnosis not present

## 2020-05-26 DIAGNOSIS — I10 Essential (primary) hypertension: Secondary | ICD-10-CM | POA: Diagnosis not present

## 2020-05-26 DIAGNOSIS — E559 Vitamin D deficiency, unspecified: Secondary | ICD-10-CM | POA: Diagnosis not present

## 2020-05-26 DIAGNOSIS — G4700 Insomnia, unspecified: Secondary | ICD-10-CM | POA: Diagnosis not present

## 2020-05-26 DIAGNOSIS — M8580 Other specified disorders of bone density and structure, unspecified site: Secondary | ICD-10-CM | POA: Diagnosis not present

## 2020-05-26 DIAGNOSIS — Z96649 Presence of unspecified artificial hip joint: Secondary | ICD-10-CM | POA: Diagnosis not present

## 2020-05-26 DIAGNOSIS — E782 Mixed hyperlipidemia: Secondary | ICD-10-CM | POA: Diagnosis not present

## 2020-05-26 DIAGNOSIS — E039 Hypothyroidism, unspecified: Secondary | ICD-10-CM | POA: Diagnosis not present

## 2020-05-26 DIAGNOSIS — R7301 Impaired fasting glucose: Secondary | ICD-10-CM | POA: Diagnosis not present

## 2020-09-02 ENCOUNTER — Ambulatory Visit (INDEPENDENT_AMBULATORY_CARE_PROVIDER_SITE_OTHER): Payer: PPO | Admitting: Orthopaedic Surgery

## 2020-09-02 ENCOUNTER — Ambulatory Visit (INDEPENDENT_AMBULATORY_CARE_PROVIDER_SITE_OTHER): Payer: PPO

## 2020-09-02 ENCOUNTER — Other Ambulatory Visit: Payer: Self-pay

## 2020-09-02 ENCOUNTER — Ambulatory Visit: Payer: Self-pay

## 2020-09-02 ENCOUNTER — Encounter: Payer: Self-pay | Admitting: Orthopaedic Surgery

## 2020-09-02 DIAGNOSIS — M65311 Trigger thumb, right thumb: Secondary | ICD-10-CM | POA: Diagnosis not present

## 2020-09-02 DIAGNOSIS — M65312 Trigger thumb, left thumb: Secondary | ICD-10-CM

## 2020-09-02 NOTE — Progress Notes (Signed)
Office Visit Note   Patient: Kelli Mason           Date of Birth: 26-Feb-1950           MRN: [id number] Visit Date: 09/02/2020              Requested by: Celene Squibb, MD [place],   [id number] PCP: Celene Squibb, MD   Assessment & Plan: Visit Diagnoses:  1. Trigger thumb of left hand     Plan: My impression is left trigger thumb.  Treatment options reviewed to include bracing and immobilization, cortisone injection, surgical release.  My recommendation is to brace and immobilize accompanied by rest and avoidance activity.  She will also take NSAIDs.  Questions encouraged and answered.  Follow-up as needed.  Follow-Up Instructions: No follow-ups on file.   Orders:  Orders Placed This Encounter  Procedures   XR Hand Complete Right   XR Hand Complete Left   No orders of the defined types were placed in this encounter.     Procedures: No procedures performed   Clinical Data: No additional findings.   Subjective: Chief Complaint  Patient presents with   Left Hand - Pain   Right Hand - Pain    Kelli Mason is a 70 year old female comes in for evaluation of left thumb pain with clicking and locking.  She performs long-arm quilting and does repetitive tasks with hand and needle and has to do 100s.  She has had tender nodule at the base of the thumb on the palmar side that causes pain with use of the thumb and pinching.   Review of Systems  Constitutional: Negative.   HENT: Negative.    Eyes: Negative.   Respiratory: Negative.    Cardiovascular: Negative.   Endocrine: Negative.   Musculoskeletal: Negative.   Neurological: Negative.   Hematological: Negative.   Psychiatric/Behavioral: Negative.    All other systems reviewed and are negative.   Objective: Vital Signs: There were no vitals taken for this visit.  Physical Exam Vitals and nursing note reviewed.  Constitutional:      Appearance: She is well-developed.  Pulmonary:     Effort:  Pulmonary effort is normal.  Skin:    General: Skin is warm.     Capillary Refill: Capillary refill takes less than 2 seconds.  Neurological:     Mental Status: She is alert and oriented to person, place, and time.  Psychiatric:        Behavior: Behavior normal.        Thought Content: Thought content normal.        Judgment: Judgment normal.    Ortho Exam Left thumb shows tender nodule to the base on the palmar side.  No active triggering.  There is significant pain with flexion of the thumb. Specialty Comments:  No specialty comments available.  Imaging: XR Hand Complete Left  Result Date: 09/02/2020 Mild CMC arthritis of the thumb.  No acute abnormalities.  XR Hand Complete Right  Result Date: 09/02/2020 Mild thumb CMC arthritis otherwise no acute abnormalities    PMFS History: Patient Active Problem List   Diagnosis Date Noted   Trigger thumb of left hand 09/02/2020   Post-traumatic osteoarthritis of right hip 11/08/2017   Primary osteoarthritis of right hip 09/26/2017   Status post total hip replacement, right 09/26/2017   Painful orthopaedic hardware ([place]) 09/26/2017   Past Medical History:  Diagnosis Date   Anemia    yrs ago  Anxiety    Arthritis    Bulging lumbar disc    L4-5   Depression    GERD (gastroesophageal reflux disease)    drinks bubbly water   Hypertension    Hypothyroidism     History reviewed. No pertinent family history.  Past Surgical History:  Procedure Laterality Date   broken foot     left  in the 70's   FRACTURE SURGERY     right femur   HARDWARE REMOVAL Right 09/26/2017   Procedure: HARDWARE REMOVAL;  Surgeon: Leandrew Koyanagi, MD;  Location: [place];  Service: Orthopedics;  Laterality: Right;   miscarriage     TOTAL HIP ARTHROPLASTY Right 09/26/2017   Procedure: RIGHT TOTAL HIP ARTHROPLASTY POSTERIOR WITH HARDWARE REMOVAL;  Surgeon: Leandrew Koyanagi, MD;  Location: [place];  Service: Orthopedics;  Laterality: Right;   Social History    Occupational History   Not on file  Tobacco Use   Smoking status: Never   Smokeless tobacco: Never  Vaping Use   Vaping Use: Never used  Substance and Sexual Activity   Alcohol use: Yes    Alcohol/week: 4.0 standard drinks    Types: 4 Glasses of wine per week    Comment: margarita with [place] food   Drug use: Not Currently   Sexual activity: Not on file

## 2020-09-29 ENCOUNTER — Other Ambulatory Visit (HOSPITAL_COMMUNITY): Payer: Self-pay | Admitting: Internal Medicine

## 2020-09-29 DIAGNOSIS — Z13820 Encounter for screening for osteoporosis: Secondary | ICD-10-CM

## 2020-09-29 DIAGNOSIS — Z780 Asymptomatic menopausal state: Secondary | ICD-10-CM

## 2020-10-03 ENCOUNTER — Ambulatory Visit
Admission: RE | Admit: 2020-10-03 | Discharge: 2020-10-03 | Disposition: A | Discharge status: Home / Self Care | Payer: PPO | Source: Ambulatory Visit | Attending: Internal Medicine | Admitting: Internal Medicine

## 2020-10-03 ENCOUNTER — Other Ambulatory Visit: Payer: Self-pay

## 2020-10-03 DIAGNOSIS — Z780 Asymptomatic menopausal state: Secondary | ICD-10-CM | POA: Diagnosis not present

## 2020-10-03 DIAGNOSIS — Z13820 Encounter for screening for osteoporosis: Secondary | ICD-10-CM

## 2020-10-03 DIAGNOSIS — M85852 Other specified disorders of bone density and structure, left thigh: Secondary | ICD-10-CM | POA: Diagnosis not present

## 2020-11-25 DIAGNOSIS — I10 Essential (primary) hypertension: Secondary | ICD-10-CM | POA: Diagnosis not present

## 2020-11-25 DIAGNOSIS — E559 Vitamin D deficiency, unspecified: Secondary | ICD-10-CM | POA: Diagnosis not present

## 2020-11-25 DIAGNOSIS — R7301 Impaired fasting glucose: Secondary | ICD-10-CM | POA: Diagnosis not present

## 2020-11-25 DIAGNOSIS — E039 Hypothyroidism, unspecified: Secondary | ICD-10-CM | POA: Diagnosis not present

## 2020-12-01 DIAGNOSIS — G4700 Insomnia, unspecified: Secondary | ICD-10-CM | POA: Diagnosis not present

## 2020-12-01 DIAGNOSIS — M8580 Other specified disorders of bone density and structure, unspecified site: Secondary | ICD-10-CM | POA: Diagnosis not present

## 2020-12-01 DIAGNOSIS — E039 Hypothyroidism, unspecified: Secondary | ICD-10-CM | POA: Diagnosis not present

## 2020-12-01 DIAGNOSIS — I10 Essential (primary) hypertension: Secondary | ICD-10-CM | POA: Diagnosis not present

## 2020-12-01 DIAGNOSIS — R7301 Impaired fasting glucose: Secondary | ICD-10-CM | POA: Diagnosis not present

## 2020-12-01 DIAGNOSIS — Z0001 Encounter for general adult medical examination with abnormal findings: Secondary | ICD-10-CM | POA: Diagnosis not present

## 2020-12-01 DIAGNOSIS — E782 Mixed hyperlipidemia: Secondary | ICD-10-CM | POA: Diagnosis not present

## 2020-12-01 DIAGNOSIS — Z23 Encounter for immunization: Secondary | ICD-10-CM | POA: Diagnosis not present

## 2020-12-01 DIAGNOSIS — E559 Vitamin D deficiency, unspecified: Secondary | ICD-10-CM | POA: Diagnosis not present

## 2020-12-01 DIAGNOSIS — Z96649 Presence of unspecified artificial hip joint: Secondary | ICD-10-CM | POA: Diagnosis not present

## 2021-06-05 ENCOUNTER — Other Ambulatory Visit: Payer: Self-pay | Admitting: Obstetrics & Gynecology

## 2021-06-05 DIAGNOSIS — Z1231 Encounter for screening mammogram for malignant neoplasm of breast: Secondary | ICD-10-CM

## 2021-06-11 ENCOUNTER — Ambulatory Visit
Admission: RE | Admit: 2021-06-11 | Discharge: 2021-06-11 | Disposition: A | Discharge status: Home / Self Care | Payer: PPO | Source: Ambulatory Visit | Attending: Obstetrics & Gynecology | Admitting: Obstetrics & Gynecology

## 2021-06-11 DIAGNOSIS — Z1231 Encounter for screening mammogram for malignant neoplasm of breast: Secondary | ICD-10-CM

## 2021-08-19 ENCOUNTER — Ambulatory Visit: Payer: PPO | Admitting: Physician Assistant

## 2021-12-01 ENCOUNTER — Ambulatory Visit: Payer: PPO | Admitting: Orthopaedic Surgery

## 2021-12-01 ENCOUNTER — Encounter: Payer: Self-pay | Admitting: Orthopaedic Surgery

## 2021-12-01 ENCOUNTER — Ambulatory Visit (INDEPENDENT_AMBULATORY_CARE_PROVIDER_SITE_OTHER): Payer: PPO

## 2021-12-01 DIAGNOSIS — M25551 Pain in right hip: Secondary | ICD-10-CM

## 2021-12-01 NOTE — Progress Notes (Signed)
Office Visit Note   Patient: Kelli Mason           Date of Birth: 02-15-1950           MRN: [id number] Visit Date: 12/01/2021              Requested by: Celene Squibb, MD [address] [place],  [place] [id number] PCP: Celene Squibb, MD   Assessment & Plan: Visit Diagnoses:  1. Pain in right hip     Plan: Impression is lateral hip trochanteric pain.  Fortunately symptoms are occasional at this time.  She would like to continue with Aleve and activity modifications.  Currently not symptomatic enough to warrant an injection.  Follow-up as needed.  Follow-Up Instructions: No follow-ups on file.   Orders:  Orders Placed This Encounter  Procedures   XR HIP UNILAT W OR W/O PELVIS 2-3 VIEWS RIGHT   No orders of the defined types were placed in this encounter.     Procedures: No procedures performed   Clinical Data: No additional findings.   Subjective: Chief Complaint  Patient presents with   Right Hip - Pain    HPI Summar is a very pleasant 71 year old female who underwent conversion to a right total hip replacement in 2019.  She has had some pinching sharp pain to the lateral aspect of the hip for the last 5 months.  There is no constant pain.  Has occasional pain.  Takes Aleve when symptomatic.  Denies any injuries.  Denies any groin pain.  Review of Systems   Objective: Vital Signs: There were no vitals taken for this visit.  Physical Exam  Ortho Exam Examination of the right hip shows fully healed surgical scar.  She is tender along the middle portion of the scar.  No signs of infection.  She has good good range of motion of the hip without pain.  She is tender to the lateral aspect of the trochanter. Specialty Comments:  No specialty comments available.  Imaging: XR HIP UNILAT W OR W/O PELVIS 2-3 VIEWS RIGHT  Result Date: 12/01/2021 2 view x-rays of the right hip shows stable right total hip replacement without any complications.  The metallic  cerclage wire is intact.    PMFS History: Patient Active Problem List   Diagnosis Date Noted   Trigger thumb of left hand 09/02/2020   Post-traumatic osteoarthritis of right hip 11/08/2017   Primary osteoarthritis of right hip 09/26/2017   Status post total hip replacement, right 09/26/2017   Painful orthopaedic hardware ([place]) 09/26/2017   Past Medical History:  Diagnosis Date   Anemia    yrs ago   Anxiety    Arthritis    Bulging lumbar disc    L4-5   Depression    GERD (gastroesophageal reflux disease)    drinks bubbly water   Hypertension    Hypothyroidism     No family history on file.  Past Surgical History:  Procedure Laterality Date   broken foot     left  in the 70's   FRACTURE SURGERY     right femur   HARDWARE REMOVAL Right 09/26/2017   Procedure: HARDWARE REMOVAL;  Surgeon: Leandrew Koyanagi, MD;  Location: [place];  Service: Orthopedics;  Laterality: Right;   miscarriage     TOTAL HIP ARTHROPLASTY Right 09/26/2017   Procedure: RIGHT TOTAL HIP ARTHROPLASTY POSTERIOR WITH HARDWARE REMOVAL;  Surgeon: Leandrew Koyanagi, MD;  Location: [place];  Service: Orthopedics;  Laterality: Right;   Social History   Occupational History   Not on file  Tobacco Use   Smoking status: Never   Smokeless tobacco: Never  Vaping Use   Vaping Use: Never used  Substance and Sexual Activity   Alcohol use: Yes    Alcohol/week: 4.0 standard drinks of alcohol    Types: 4 Glasses of wine per week    Comment: margarita with [place] food   Drug use: Not Currently   Sexual activity: Not on file

## 2022-01-21 ENCOUNTER — Encounter: Payer: Self-pay | Admitting: *Deleted

## 2022-02-08 ENCOUNTER — Telehealth: Payer: Self-pay | Admitting: Gastroenterology

## 2022-02-08 NOTE — Telephone Encounter (Signed)
Patient left a message that she sent her colonoscopy questionnaire back in over a week ago and hasn't heard anything from our office.  Could you check the status on that?

## 2022-02-08 NOTE — Telephone Encounter (Signed)
We are up to date with triages and we do not have hers. Mail is a little slow, could come in this week

## 2022-02-09 ENCOUNTER — Encounter: Payer: Self-pay | Admitting: Gastroenterology

## 2022-03-09 NOTE — Progress Notes (Unsigned)
Referring Provider:Hall, Edwinna Areola, MD Primary Care Physician:  Celene Squibb, MD Primary Gastroenterologist:  Dr. Gala Romney  Chief Complaint  Patient presents with   Colonoscopy    Positive cologuard, last colonoscopy was in [place] in 2008.     HPI:   Kelli Mason is a 72 y.o. female presenting today at the request of [place] Crane, Edwinna Areola, MD for consult colonoscopy. Recommended OV due to ETOH.   Positive cologuard a couple months ago.   No GI concerns.  Denies unintentional weight loss, BRBPR, melena.  She does have occasional diarrhea depending on what she eats.  No constipation.  No upper GI symptoms such as nausea, vomiting, reflux, dysphagia.  Last clinic in 2008 in [place].  No polyps.  Only occasional alcohol use.   No family history of colon cancer.  Past Medical History:  Diagnosis Date   Anemia    yrs ago   Anxiety    Arthritis    Bulging lumbar disc    L4-5   Depression    GERD (gastroesophageal reflux disease)    drinks bubbly water   Hypertension    Hypothyroidism     Past Surgical History:  Procedure Laterality Date   broken foot     left  in the 70's   FRACTURE SURGERY     right femur   HARDWARE REMOVAL Right 09/26/2017   Procedure: HARDWARE REMOVAL;  Surgeon: Leandrew Koyanagi, MD;  Location: [place];  Service: Orthopedics;  Laterality: Right;   miscarriage     TOTAL HIP ARTHROPLASTY Right 09/26/2017   Procedure: RIGHT TOTAL HIP ARTHROPLASTY POSTERIOR WITH HARDWARE REMOVAL;  Surgeon: Leandrew Koyanagi, MD;  Location: [place];  Service: Orthopedics;  Laterality: Right;    Current Outpatient Medications  Medication Sig Dispense Refill   desvenlafaxine (PRISTIQ) 100 MG 24 hr tablet Take 100 mg by mouth daily.   3   hydrochlorothiazide (HYDRODIURIL) 25 MG tablet Take 25 mg by mouth daily.  3   levothyroxine (SYNTHROID, LEVOTHROID) 150 MCG tablet Take 150 mcg by mouth daily before breakfast.   1   Multiple Vitamin (MULTIVITAMIN WITH MINERALS) TABS tablet Take 1 tablet by  mouth daily. Women's Multivitamin     olmesartan (BENICAR) 40 MG tablet Take 40 mg by mouth daily.  2   Vitamin D, Ergocalciferol, (DRISDOL) 1.25 MG (50000 UNIT) CAPS capsule Take 50,000 Units by mouth once a week.     No current facility-administered medications for this visit.    Allergies as of 03/11/2022 - Review Complete 03/11/2022  Allergen Reaction Noted   Sulfa antibiotics  11/29/2016   Keflex [cephalexin] Rash 11/29/2016    Family History  Problem Relation Age of Onset   Colon cancer Neg Hx    Colon polyps Neg Hx     Social History   Socioeconomic History   Marital status: Single    Spouse name: Not on file   Number of children: Not on file   Years of education: Not on file   Highest education level: Not on file  Occupational History   Not on file  Tobacco Use   Smoking status: Never   Smokeless tobacco: Never  Vaping Use   Vaping Use: Never used  Substance and Sexual Activity   Alcohol use: Yes    Alcohol/week: 4.0 standard drinks of alcohol    Types: 4 Glasses of wine per week    Comment: margarita with [place] food   Drug use: Not Currently   Sexual  activity: Not Currently  Other Topics Concern   Not on file  Social History Narrative   Not on file   Social Determinants of Health   Financial Resource Strain: Not on file  Food Insecurity: Not on file  Transportation Needs: Not on file  Physical Activity: Not on file  Stress: Not on file  Social Connections: Not on file  Intimate Partner Violence: Not on file    Review of Systems: Gen: Denies any fever, chills, cold or flulike symptoms, presyncope, syncope. CV: Denies chest pain, heart palpitations. Resp: Denies shortness of breath, cough. GI: See HPI GU : Denies urinary burning, urinary frequency, urinary hesitancy MS: Denies joint pain. Derm: Denies rash. Psych: Denies depression, anxiety. Heme: See HPI  Physical Exam: BP 128/80 (BP Location: Right Arm, Patient Position: Sitting, Cuff  Size: Large)   Pulse 67   Temp 97.9 F (36.6 C) (Oral)   Ht 5' 2.5" (1.588 m)   Wt 209 lb 6.4 oz (95 kg)   SpO2 96%   BMI 37.69 kg/m  General:   Alert and oriented. Pleasant and cooperative. Well-nourished and well-developed.  Head:  Normocephalic and atraumatic. Eyes:  Without icterus, sclera clear and conjunctiva pink.  Ears:  Normal auditory acuity. Lungs:  Clear to auscultation bilaterally. No wheezes, rales, or rhonchi. No distress.  Heart:  S1, S2 present without murmurs appreciated.  Abdomen:  +BS, soft, non-tender and non-distended. No HSM noted. No guarding or rebound. No masses appreciated.  Rectal:  Deferred  Msk:  Symmetrical without gross deformities. Normal posture. Extremities:  Without edema. Neurologic:  Alert and  oriented x4;  grossly normal neurologically. Skin:  Intact without significant lesions or rashes. Psych: Normal mood and affect.    Assessment:  72 year old female with history of HTN, hypothyroidism, anxiety/depression, presenting today to discuss scheduling colonoscopy following recent positive Cologuard.  She has no significant upper or lower GI symptoms.  No alarm symptoms.  Reports her last colonoscopy was in 2008 in [place] without colon polyps.  No family history of colon cancer.   Plan:  Proceed with colonoscopy with propofol by Dr. Gala Romney in near future. The risks, benefits, and alternatives have been discussed with the patient in detail. The patient states understanding and desires to proceed.  ASA 2 BMP prior Follow-up PRN   Aliene Altes, Hershal Coria [facility] 03/11/2022

## 2022-03-11 ENCOUNTER — Ambulatory Visit (INDEPENDENT_AMBULATORY_CARE_PROVIDER_SITE_OTHER): Payer: PPO | Admitting: Gastroenterology

## 2022-03-11 ENCOUNTER — Other Ambulatory Visit: Payer: Self-pay | Admitting: *Deleted

## 2022-03-11 ENCOUNTER — Encounter: Payer: Self-pay | Admitting: *Deleted

## 2022-03-11 ENCOUNTER — Encounter: Payer: Self-pay | Admitting: Gastroenterology

## 2022-03-11 VITALS — BP 128/80 | HR 67 | Temp 97.9°F | Ht 62.5 in | Wt 209.4 lb

## 2022-03-11 DIAGNOSIS — R195 Other fecal abnormalities: Secondary | ICD-10-CM

## 2022-03-11 MED ORDER — PEG 3350-KCL-NA BICARB-NACL 420 G PO SOLR: 4000.0000 mL | Freq: Once | ORAL | 0 refills | Status: AC

## 2022-03-11 NOTE — Patient Instructions (Signed)
We will arrange for you to have a colonoscopy with Dr. Gala Romney at [facility].  Will follow-up with you in our office as needed.  Do not hesitate to call if you develop any new GI concerns.  It was very nice to meet you today!  Aliene Altes, PA-C [facility]

## 2022-03-22 DIAGNOSIS — D3131 Benign neoplasm of right choroid: Secondary | ICD-10-CM | POA: Diagnosis not present

## 2022-03-22 DIAGNOSIS — H43813 Vitreous degeneration, bilateral: Secondary | ICD-10-CM | POA: Diagnosis not present

## 2022-03-22 DIAGNOSIS — H2513 Age-related nuclear cataract, bilateral: Secondary | ICD-10-CM | POA: Diagnosis not present

## 2022-04-07 DIAGNOSIS — L821 Other seborrheic keratosis: Secondary | ICD-10-CM | POA: Diagnosis not present

## 2022-04-07 DIAGNOSIS — L814 Other melanin hyperpigmentation: Secondary | ICD-10-CM | POA: Diagnosis not present

## 2022-04-07 DIAGNOSIS — D2261 Melanocytic nevi of right upper limb, including shoulder: Secondary | ICD-10-CM | POA: Diagnosis not present

## 2022-04-07 DIAGNOSIS — D224 Melanocytic nevi of scalp and neck: Secondary | ICD-10-CM | POA: Diagnosis not present

## 2022-04-07 DIAGNOSIS — D2262 Melanocytic nevi of left upper limb, including shoulder: Secondary | ICD-10-CM | POA: Diagnosis not present

## 2022-04-07 DIAGNOSIS — L570 Actinic keratosis: Secondary | ICD-10-CM | POA: Diagnosis not present

## 2022-04-07 DIAGNOSIS — D225 Melanocytic nevi of trunk: Secondary | ICD-10-CM | POA: Diagnosis not present

## 2022-04-21 ENCOUNTER — Other Ambulatory Visit (HOSPITAL_COMMUNITY)
Admission: RE | Admit: 2022-04-21 | Discharge: 2022-04-21 | Disposition: A | Discharge status: Home / Self Care | Payer: PPO | Source: Ambulatory Visit | Attending: Internal Medicine | Admitting: Internal Medicine

## 2022-04-21 DIAGNOSIS — R195 Other fecal abnormalities: Secondary | ICD-10-CM | POA: Diagnosis not present

## 2022-04-21 LAB — BASIC METABOLIC PANEL
Anion gap: 9 (ref 5–15)
BUN: 19 mg/dL (ref 8–23)
CO2: 26 mmol/L (ref 22–32)
Calcium: 9.2 mg/dL (ref 8.9–10.3)
Chloride: 101 mmol/L (ref 98–111)
Creatinine, Ser: 1.01 mg/dL — ABNORMAL HIGH (ref 0.44–1.00)
GFR, Estimated: 60 mL/min — ABNORMAL LOW (ref >60)
Glucose, Bld: 115 mg/dL — ABNORMAL HIGH (ref 70–99)
Potassium: 3.9 mmol/L (ref 3.5–5.1)
Sodium: 136 mmol/L (ref 135–145)

## 2022-04-29 ENCOUNTER — Ambulatory Visit (HOSPITAL_COMMUNITY)
Admission: RE | Admit: 2022-04-29 | Discharge: 2022-04-29 | Disposition: A | Discharge status: Home / Self Care | Payer: PPO | Attending: Internal Medicine | Admitting: Internal Medicine

## 2022-04-29 ENCOUNTER — Ambulatory Visit (HOSPITAL_BASED_OUTPATIENT_CLINIC_OR_DEPARTMENT_OTHER): Payer: PPO | Admitting: Anesthesiology

## 2022-04-29 ENCOUNTER — Encounter (HOSPITAL_COMMUNITY)
Admission: RE | Disposition: A | Discharge status: Home / Self Care | Payer: Self-pay | Source: Home / Self Care | Attending: Internal Medicine

## 2022-04-29 ENCOUNTER — Encounter (HOSPITAL_COMMUNITY): Payer: Self-pay | Admitting: Internal Medicine

## 2022-04-29 ENCOUNTER — Other Ambulatory Visit: Payer: Self-pay

## 2022-04-29 ENCOUNTER — Ambulatory Visit (HOSPITAL_COMMUNITY): Payer: PPO | Admitting: Anesthesiology

## 2022-04-29 DIAGNOSIS — D638 Anemia in other chronic diseases classified elsewhere: Secondary | ICD-10-CM | POA: Diagnosis not present

## 2022-04-29 DIAGNOSIS — I10 Essential (primary) hypertension: Secondary | ICD-10-CM | POA: Insufficient documentation

## 2022-04-29 DIAGNOSIS — R195 Other fecal abnormalities: Secondary | ICD-10-CM

## 2022-04-29 DIAGNOSIS — Z1211 Encounter for screening for malignant neoplasm of colon: Secondary | ICD-10-CM | POA: Insufficient documentation

## 2022-04-29 DIAGNOSIS — D649 Anemia, unspecified: Secondary | ICD-10-CM | POA: Insufficient documentation

## 2022-04-29 DIAGNOSIS — D759 Disease of blood and blood-forming organs, unspecified: Secondary | ICD-10-CM | POA: Insufficient documentation

## 2022-04-29 DIAGNOSIS — E039 Hypothyroidism, unspecified: Secondary | ICD-10-CM | POA: Insufficient documentation

## 2022-04-29 DIAGNOSIS — F32A Depression, unspecified: Secondary | ICD-10-CM | POA: Insufficient documentation

## 2022-04-29 HISTORY — PX: FLEXIBLE SIGMOIDOSCOPY: SHX5431

## 2022-04-29 SURGERY — SIGMOIDOSCOPY, FLEXIBLE
Anesthesia: General

## 2022-04-29 MED ORDER — LACTATED RINGERS IV SOLN
INTRAVENOUS | Status: DC
  Administered 2022-04-29: 1000 mL via INTRAVENOUS

## 2022-04-29 MED ORDER — PROPOFOL 500 MG/50ML IV EMUL
INTRAVENOUS | Status: DC | PRN
  Administered 2022-04-29: 150 ug/kg/min via INTRAVENOUS

## 2022-04-29 MED ORDER — PHENYLEPHRINE 80 MCG/ML (10ML) SYRINGE FOR IV PUSH (FOR BLOOD PRESSURE SUPPORT)
PREFILLED_SYRINGE | INTRAVENOUS | Status: DC | PRN
  Administered 2022-04-29: 160 ug via INTRAVENOUS

## 2022-04-29 MED ORDER — LIDOCAINE HCL (CARDIAC) PF 100 MG/5ML IV SOSY
PREFILLED_SYRINGE | INTRAVENOUS | Status: DC | PRN
  Administered 2022-04-29: 50 mg via INTRAVENOUS

## 2022-04-29 MED ORDER — PROPOFOL 10 MG/ML IV BOLUS
INTRAVENOUS | Status: DC | PRN
  Administered 2022-04-29: 100 mg via INTRAVENOUS

## 2022-04-29 MED ORDER — PHENYLEPHRINE 80 MCG/ML (10ML) SYRINGE FOR IV PUSH (FOR BLOOD PRESSURE SUPPORT)
PREFILLED_SYRINGE | INTRAVENOUS | Status: AC
  Filled 2022-04-29: qty 10

## 2022-04-29 NOTE — Anesthesia Preprocedure Evaluation (Addendum)
Anesthesia Evaluation  Patient identified by MRN, date of birth, ID band Patient awake    Reviewed: Allergy & Precautions, H&P , NPO status , Patient's Chart, lab work & pertinent test results  Airway Mallampati: III  TM Distance: >3 FB Neck ROM: Full    Dental  (+) Dental Advisory Given, Teeth Intact   Pulmonary sleep apnea (snoring)    Pulmonary exam normal breath sounds clear to auscultation       Cardiovascular hypertension, Pt. on medications Normal cardiovascular exam Rhythm:Regular Rate:Normal     Neuro/Psych  PSYCHIATRIC DISORDERS Anxiety Depression    negative neurological ROS     GI/Hepatic Neg liver ROS,GERD  ,,  Endo/Other  Hypothyroidism    Renal/GU negative Renal ROS  negative genitourinary   Musculoskeletal  (+) Arthritis , Osteoarthritis,    Abdominal   Peds negative pediatric ROS (+)  Hematology  (+) Blood dyscrasia, anemia   Anesthesia Other Findings   Reproductive/Obstetrics negative OB ROS                             Anesthesia Physical Anesthesia Plan  ASA: 2  Anesthesia Plan: General   Post-op Pain Management: Minimal or no pain anticipated   Induction:   PONV Risk Score and Plan: Propofol infusion  Airway Management Planned: Nasal Cannula and Natural Airway  Additional Equipment:   Intra-op Plan:   Post-operative Plan:   Informed Consent: I have reviewed the patients History and Physical, chart, labs and discussed the procedure including the risks, benefits and alternatives for the proposed anesthesia with the patient or authorized representative who has indicated his/her understanding and acceptance.     Dental advisory given  Plan Discussed with: CRNA and Surgeon  Anesthesia Plan Comments:        Anesthesia Quick Evaluation

## 2022-04-29 NOTE — Discharge Instructions (Signed)
  Sigmoidoscopy Discharge Instructions  Read the instructions outlined below and refer to this sheet in the next few weeks. These discharge instructions provide you with general information on caring for yourself after you leave the hospital. Your doctor may also give you specific instructions. While your treatment has been planned according to the most current medical practices available, unavoidable complications occasionally occur. If you have any problems or questions after discharge, call Dr. Jena Gauss at [phone]. ACTIVITY You may resume your regular activity, but move at a slower pace for the next 24 hours.  Take frequent rest periods for the next 24 hours.  Walking will help get rid of the air and reduce the bloated feeling in your belly (abdomen).  No driving for 24 hours (because of the medicine (anesthesia) used during the test).   Do not sign any important legal documents or operate any machinery for 24 hours (because of the anesthesia used during the test).  NUTRITION Drink plenty of fluids.  You may resume your normal diet as instructed by your doctor.  Begin with a light meal and progress to your normal diet. Heavy or fried foods are harder to digest and may make you feel sick to your stomach (nauseated).  Avoid alcoholic beverages for 24 hours or as instructed.  MEDICATIONS You may resume your normal medications unless your doctor tells you otherwise.  WHAT YOU CAN EXPECT TODAY Some feelings of bloating in the abdomen.  Passage of more gas than usual.  Spotting of blood in your stool or on the toilet paper.  IF YOU HAD POLYPS REMOVED DURING THE COLONOSCOPY: No aspirin products for 7 days or as instructed.  No alcohol for 7 days or as instructed.  Eat a soft diet for the next 24 hours.  FINDING OUT THE RESULTS OF YOUR TEST Not all test results are available during your visit. If your test results are not back during the visit, make an appointment with your caregiver to find out  the results. Do not assume everything is normal if you have not heard from your caregiver or the medical facility. It is important for you to follow up on all of your test results.  SEEK IMMEDIATE MEDICAL ATTENTION IF: You have more than a spotting of blood in your stool.  Your belly is swollen (abdominal distention).  You are nauseated or vomiting.  You have a temperature over 101.  You have abdominal pain or discomfort that is severe or gets worse throughout the day.     Your colon preparation was entirely inadequate today.  Colonoscopy could not be completed.  Office visit with Ermalinda Memos in 4 to 6 weeks  At patient request, I called Rebbeca Paul at [phone] findings and recommendations

## 2022-04-29 NOTE — Anesthesia Procedure Notes (Signed)
Date/Time: 04/29/2022 8:29 AM  Performed by: Julian Reil, CRNAPre-anesthesia Checklist: Patient identified, Emergency Drugs available, Suction available and Patient being monitored Patient Re-evaluated:Patient Re-evaluated prior to induction Oxygen Delivery Method: Nasal cannula Induction Type: IV induction Placement Confirmation: positive ETCO2

## 2022-04-29 NOTE — H&P (Signed)
 @   Primary Care Physician:  Benita Stabile, MD Primary Gastroenterologist:  Dr. Jena Gauss  Pre-Procedure History & Physical: HPI:  Kelli Mason is a 72 y.o. female here for for colonoscopy given positive Cologuard.  Negative colonoscopy 2008 reportedly in [place].  No bowel symptoms at this time.  Past Medical History:  Diagnosis Date   Anemia    yrs ago   Anxiety    Arthritis    Bulging lumbar disc    L4-5   Depression    GERD (gastroesophageal reflux disease)    drinks bubbly water   Hypertension    Hypothyroidism     Past Surgical History:  Procedure Laterality Date   broken foot     left  in the 70's   DIAGNOSTIC LAPAROSCOPY     FRACTURE SURGERY     right femur   HARDWARE REMOVAL Right 09/26/2017   Procedure: HARDWARE REMOVAL;  Surgeon: Tarry Kos, MD;  Location: [facility] OR;  Service: Orthopedics;  Laterality: Right;   miscarriage     TOTAL HIP ARTHROPLASTY Right 09/26/2017   Procedure: RIGHT TOTAL HIP ARTHROPLASTY POSTERIOR WITH HARDWARE REMOVAL;  Surgeon: Tarry Kos, MD;  Location: [facility] OR;  Service: Orthopedics;  Laterality: Right;    Prior to Admission medications   Medication Sig Start Date End Date Taking? Authorizing Provider  desvenlafaxine (PRISTIQ) 100 MG 24 hr tablet Take 100 mg by mouth daily.  11/11/16  Yes [provider]  hydrochlorothiazide (HYDRODIURIL) 25 MG tablet Take 25 mg by mouth daily. 09/01/17  Yes [provider]  levothyroxine (SYNTHROID, LEVOTHROID) 150 MCG tablet Take 150 mcg by mouth daily before breakfast.  10/17/16  Yes [provider]  olmesartan (BENICAR) 40 MG tablet Take 40 mg by mouth daily. 09/01/17  Yes [provider]  Vitamin D, Ergocalciferol, (DRISDOL) 1.25 MG (50000 UNIT) CAPS capsule Take 50,000 Units by mouth once a week. 10/27/21  Yes [provider]    Allergies as of 03/11/2022 - Review Complete 03/11/2022  Allergen Reaction Noted   Sulfa antibiotics  11/29/2016    Keflex [cephalexin] Rash 11/29/2016    Family History  Problem Relation Age of Onset   Colon cancer Neg Hx    Colon polyps Neg Hx     Social History   Socioeconomic History   Marital status: Single    Spouse name: Not on file   Number of children: Not on file   Years of education: Not on file   Highest education level: Not on file  Occupational History   Not on file  Tobacco Use   Smoking status: Never   Smokeless tobacco: Never  Vaping Use   Vaping Use: Never used  Substance and Sexual Activity   Alcohol use: Yes    Alcohol/week: 4.0 standard drinks of alcohol    Types: 4 Glasses of wine per week    Comment: margarita with [place] food   Drug use: Not Currently   Sexual activity: Not Currently  Other Topics Concern   Not on file  Social History Narrative   Not on file   Social Determinants of Health   Financial Resource Strain: Not on file  Food Insecurity: Not on file  Transportation Needs: Not on file  Physical Activity: Not on file  Stress: Not on file  Social Connections: Not on file  Intimate Partner Violence: Not on file    Review of Systems: See HPI, otherwise negative ROS  Physical Exam: BP 125/77   Pulse  73   Temp 99 F (37.2 C) (Oral)   Resp 14   Ht 5' 2.5" (1.588 m)   Wt 94.3 kg   SpO2 94%   BMI 37.44 kg/m  General:   Alert,  Well-developed, well-nourished, pleasant and cooperative in NAD Lungs:  Clear throughout to auscultation.   No wheezes, crackles, or rhonchi. No acute distress. Heart:  Regular rate and rhythm; no murmurs, clicks, rubs,  or gallops. Abdomen: Non-distended, normal bowel sounds.  Soft and nontender without appreciable mass or hepatosplenomegaly.  Impression/Plan: 72 year old lady here for colonoscopy given positive Cologuard. The risks, benefits, limitations, alternatives and imponderables have been reviewed with the patient. Questions have been answered. All parties are agreeable.       Notice: This dictation was  prepared with Dragon dictation along with smaller phrase technology. Any transcriptional errors that result from this process are unintentional and may not be corrected upon review.

## 2022-04-29 NOTE — Transfer of Care (Signed)
Immediate Anesthesia Transfer of Care Note  Patient: Kelli Mason  Procedure(s) Performed: FLEXIBLE SIGMOIDOSCOPY  Patient Location: Endoscopy Unit  Anesthesia Type:General  Level of Consciousness: awake  Airway & Oxygen Therapy: Patient Spontanous Breathing  Post-op Assessment: Report given to RN and Post -op Vital signs reviewed and stable  Post vital signs: Reviewed and stable  Last Vitals:  Vitals Value Taken Time  BP    Temp    Pulse    Resp    SpO2      Last Pain:  Vitals:   04/29/22 0826  TempSrc:   PainSc: 0-No pain      Patients Stated Pain Goal: 8 (04/29/22 0737)  Complications: No notable events documented.

## 2022-04-29 NOTE — Op Note (Signed)
[facility] Patient Name: Kelli Mason Procedure Date: 04/29/2022 7:57 AM MRN: [id number] Date of Birth: [birth year] Attending MD: Gennette Pac , MD, [id number] CSN: [id number] Age: 72 Admit Type: Outpatient Procedure:                Sigmoidoscopy (attempted colonoscopy) Indications:              Positive Cologuard test Providers:                Gennette Pac, MD, Angelica Ran, Dyann Ruddle Referring MD:              Medicines:                Propofol per Anesthesia Complications:            No immediate complications. Estimated Blood Loss:     Estimated blood loss: none. Procedure:                Pre-Anesthesia Assessment:                           - Prior to the procedure, a History and Physical                            was performed, and patient medications and                            allergies were reviewed. The patient's tolerance of                            previous anesthesia was also reviewed. The risks                            and benefits of the procedure and the sedation                            options and risks were discussed with the patient.                            All questions were answered, and informed consent                            was obtained. Prior Anticoagulants: The patient has                            taken no anticoagulant or antiplatelet agents. ASA                            Grade Assessment: II - A patient with mild systemic                            disease. After reviewing the risks and benefits,                            the patient was deemed in satisfactory condition to  undergo the procedure.                           After obtaining informed consent, the colonoscope                            was passed under direct vision. Throughout the                            procedure, the patient's blood pressure, pulse, and                            oxygen saturations were monitored  continuously. The                            [phone]) scope was introduced through the                            anus and advanced to the the rectum. The                            colonoscopy was performed without difficulty. The                            patient tolerated the procedure well. The quality                            of the bowel preparation was inadequate. The rectum                            was photographed. Scope In: 8:31:45 AM Scope Out: 8:33:41 AM Total Procedure Duration: 0 hours 1 minute 56 seconds  Findings:      The perianal and digital rectal examinations were normal. Formed and       semiformed stool in the rectum traveling up into the sigmoid. Patient       appeared to be obviously not adequately prep. Procedure aborted. Impression:               - Preparation of the colon was inadequate.                            Procedure aborted.                           - No specimens collected. Moderate Sedation:      Moderate (conscious) sedation was personally administered by an       anesthesia professional. The following parameters were monitored: oxygen       saturation, heart rate, blood pressure, respiratory rate, EKG, adequacy       of pulmonary ventilation, and response to care. Recommendation:           - Patient has a contact number available for                            emergencies. The signs and symptoms of potential  delayed complications were discussed with the                            patient. Return to normal activities tomorrow.                            Written discharge instructions were provided to the                            patient.                           - Advance diet as tolerated.                           - Continue present medications.                           - Repeat colonoscopy at next available appointment                            (within 3 months) because the bowel preparation was                             poor.                           - Return to GI office in 6 weeks. Procedure Code(s):        --- Professional ---                           [phone], 53, Colonoscopy, flexible; diagnostic,                            including collection of specimen(s) by brushing or                            washing, when performed (separate procedure) Diagnosis Code(s):        --- Professional ---                           R19.5, Other fecal abnormalities CPT copyright 2022 American Medical Association. All rights reserved. The codes documented in this report are preliminary and upon coder review may  be revised to meet current compliance requirements. Gerrit Friends. , MD Gennette Pac, MD 04/29/2022 8:43:37 AM This report has been signed electronically. Number of Addenda: 0

## 2022-04-29 NOTE — Anesthesia Postprocedure Evaluation (Signed)
Anesthesia Post Note  Patient: Kelli Mason  Procedure(s) Performed: FLEXIBLE SIGMOIDOSCOPY  Patient location during evaluation: Phase II Anesthesia Type: General Level of consciousness: awake and alert and oriented Pain management: pain level controlled Vital Signs Assessment: post-procedure vital signs reviewed and stable Respiratory status: spontaneous breathing, nonlabored ventilation and respiratory function stable Cardiovascular status: blood pressure returned to baseline and stable Postop Assessment: no apparent nausea or vomiting Anesthetic complications: no  No notable events documented.   Last Vitals:  Vitals:   04/29/22 0737 04/29/22 0836  BP: 125/77 (!) 91/47  Pulse: 73 66  Resp: 14 16  Temp: 37.2 C 36.9 C  SpO2: 94% 91%    Last Pain:  Vitals:   04/29/22 0836  TempSrc: Oral  PainSc: 0-No pain                  C 

## 2022-05-04 ENCOUNTER — Encounter (HOSPITAL_COMMUNITY): Payer: Self-pay | Admitting: Internal Medicine

## 2022-05-14 ENCOUNTER — Other Ambulatory Visit: Payer: Self-pay | Admitting: Internal Medicine

## 2022-05-14 DIAGNOSIS — Z1231 Encounter for screening mammogram for malignant neoplasm of breast: Secondary | ICD-10-CM

## 2022-05-28 DIAGNOSIS — E782 Mixed hyperlipidemia: Secondary | ICD-10-CM | POA: Diagnosis not present

## 2022-05-28 DIAGNOSIS — R7301 Impaired fasting glucose: Secondary | ICD-10-CM | POA: Diagnosis not present

## 2022-05-28 DIAGNOSIS — E039 Hypothyroidism, unspecified: Secondary | ICD-10-CM | POA: Diagnosis not present

## 2022-05-28 DIAGNOSIS — E559 Vitamin D deficiency, unspecified: Secondary | ICD-10-CM | POA: Diagnosis not present

## 2022-06-02 DIAGNOSIS — Z0000 Encounter for general adult medical examination without abnormal findings: Secondary | ICD-10-CM | POA: Diagnosis not present

## 2022-06-03 DIAGNOSIS — Z96649 Presence of unspecified artificial hip joint: Secondary | ICD-10-CM | POA: Diagnosis not present

## 2022-06-03 DIAGNOSIS — R944 Abnormal results of kidney function studies: Secondary | ICD-10-CM | POA: Diagnosis not present

## 2022-06-03 DIAGNOSIS — M8580 Other specified disorders of bone density and structure, unspecified site: Secondary | ICD-10-CM | POA: Diagnosis not present

## 2022-06-03 DIAGNOSIS — E039 Hypothyroidism, unspecified: Secondary | ICD-10-CM | POA: Diagnosis not present

## 2022-06-03 DIAGNOSIS — I10 Essential (primary) hypertension: Secondary | ICD-10-CM | POA: Diagnosis not present

## 2022-06-03 DIAGNOSIS — Z0000 Encounter for general adult medical examination without abnormal findings: Secondary | ICD-10-CM | POA: Diagnosis not present

## 2022-06-03 DIAGNOSIS — G4700 Insomnia, unspecified: Secondary | ICD-10-CM | POA: Diagnosis not present

## 2022-06-03 DIAGNOSIS — R7303 Prediabetes: Secondary | ICD-10-CM | POA: Diagnosis not present

## 2022-06-03 DIAGNOSIS — E559 Vitamin D deficiency, unspecified: Secondary | ICD-10-CM | POA: Diagnosis not present

## 2022-06-03 DIAGNOSIS — E782 Mixed hyperlipidemia: Secondary | ICD-10-CM | POA: Diagnosis not present

## 2022-06-03 DIAGNOSIS — R195 Other fecal abnormalities: Secondary | ICD-10-CM | POA: Diagnosis not present

## 2022-06-09 NOTE — Progress Notes (Unsigned)
Referring Provider: Benita Stabile, MD Primary Care Physician:  Benita Stabile, MD Primary GI Physician: Dr. Jena Gauss  No chief complaint on file.   HPI:   Kelli Mason is a 72 y.o. female presenting today to discuss rescheduling colonoscopy.   I last saw patient in March 2024 to schedule her colonoscopy. She denied any significant GI symptoms. Colonoscopy completed 04/29/22 but prep was aborted due to poor prep. Recommended OV in 6 weeks to discuss rescheduling colonoscopy.   Today:    Past Medical History:  Diagnosis Date   Anemia    yrs ago   Anxiety    Arthritis    Bulging lumbar disc    L4-5   Depression    GERD (gastroesophageal reflux disease)    drinks bubbly water   Hypertension    Hypothyroidism     Past Surgical History:  Procedure Laterality Date   broken foot     left  in the 70's   DIAGNOSTIC LAPAROSCOPY     FLEXIBLE SIGMOIDOSCOPY N/A 04/29/2022   Procedure: FLEXIBLE SIGMOIDOSCOPY;  Surgeon: Corbin Ade, MD;  Location: [facility];  Service: Endoscopy;  Laterality: N/A;   FRACTURE SURGERY     right femur   HARDWARE REMOVAL Right 09/26/2017   Procedure: HARDWARE REMOVAL;  Surgeon: Tarry Kos, MD;  Location: [facility] OR;  Service: Orthopedics;  Laterality: Right;   miscarriage     TOTAL HIP ARTHROPLASTY Right 09/26/2017   Procedure: RIGHT TOTAL HIP ARTHROPLASTY POSTERIOR WITH HARDWARE REMOVAL;  Surgeon: Tarry Kos, MD;  Location: [facility] OR;  Service: Orthopedics;  Laterality: Right;    Current Outpatient Medications  Medication Sig Dispense Refill   desvenlafaxine (PRISTIQ) 100 MG 24 hr tablet Take 100 mg by mouth daily.   3   hydrochlorothiazide (HYDRODIURIL) 25 MG tablet Take 25 mg by mouth daily.  3   levothyroxine (SYNTHROID, LEVOTHROID) 150 MCG tablet Take 150 mcg by mouth daily before breakfast.   1   olmesartan (BENICAR) 40 MG tablet Take 40 mg by mouth daily.  2   Vitamin D, Ergocalciferol, (DRISDOL) 1.25 MG (50000 UNIT) CAPS capsule Take  50,000 Units by mouth once a week.     No current facility-administered medications for this visit.    Allergies as of 06/10/2022 - Review Complete 04/29/2022  Allergen Reaction Noted   Keflex [cephalexin] Rash 11/29/2016   Sulfa antibiotics Rash 11/29/2016    Family History  Problem Relation Age of Onset   Colon cancer Neg Hx    Colon polyps Neg Hx     Social History   Socioeconomic History   Marital status: Single    Spouse name: Not on file   Number of children: Not on file   Years of education: Not on file   Highest education level: Not on file  Occupational History   Not on file  Tobacco Use   Smoking status: Never   Smokeless tobacco: Never  Vaping Use   Vaping Use: Never used  Substance and Sexual Activity   Alcohol use: Yes    Alcohol/week: 4.0 standard drinks of alcohol    Types: 4 Glasses of wine per week    Comment: margarita with [place] food   Drug use: Not Currently   Sexual activity: Not Currently  Other Topics Concern   Not on file  Social History Narrative   Not on file   Social Determinants of Health   Financial Resource Strain: Not on file  Food  Insecurity: Not on file  Transportation Needs: Not on file  Physical Activity: Not on file  Stress: Not on file  Social Connections: Not on file    Review of Systems: Gen: Denies fever, chills, anorexia. Denies fatigue, weakness, weight loss.  CV: Denies chest pain, palpitations, syncope, peripheral edema, and claudication. Resp: Denies dyspnea at rest, cough, wheezing, coughing up blood, and pleurisy. GI: Denies vomiting blood, jaundice, and fecal incontinence.   Denies dysphagia or odynophagia. Derm: Denies rash, itching, dry skin Psych: Denies depression, anxiety, memory loss, confusion. No homicidal or suicidal ideation.  Heme: Denies bruising, bleeding, and enlarged lymph nodes.  Physical Exam: There were no vitals taken for this visit. General:   Alert and oriented. No distress noted.  Pleasant and cooperative.  Head:  Normocephalic and atraumatic. Eyes:  Conjuctiva clear without scleral icterus. Heart:  S1, S2 present without murmurs appreciated. Lungs:  Clear to auscultation bilaterally. No wheezes, rales, or rhonchi. No distress.  Abdomen:  +BS, soft, non-tender and non-distended. No rebound or guarding. No HSM or masses noted. Msk:  Symmetrical without gross deformities. Normal posture. Extremities:  Without edema. Neurologic:  Alert and  oriented x4 Psych:  Normal mood and affect.    Assessment:     Plan:  ***   Ermalinda Memos, PA-C [facility] 06/10/2022

## 2022-06-10 ENCOUNTER — Ambulatory Visit (INDEPENDENT_AMBULATORY_CARE_PROVIDER_SITE_OTHER): Payer: PPO | Admitting: Gastroenterology

## 2022-06-10 ENCOUNTER — Encounter: Payer: Self-pay | Admitting: Gastroenterology

## 2022-06-10 VITALS — BP 132/83 | HR 70 | Temp 97.5°F | Ht 63.0 in | Wt 219.0 lb

## 2022-06-10 DIAGNOSIS — Z1211 Encounter for screening for malignant neoplasm of colon: Secondary | ICD-10-CM

## 2022-06-10 DIAGNOSIS — R195 Other fecal abnormalities: Secondary | ICD-10-CM | POA: Diagnosis not present

## 2022-06-10 NOTE — Patient Instructions (Signed)
Call when you are ready to schedule a colonoscopy.   Ermalinda Memos, PA-C [facility]

## 2022-06-15 ENCOUNTER — Ambulatory Visit
Admission: RE | Admit: 2022-06-15 | Discharge: 2022-06-15 | Disposition: A | Discharge status: Home / Self Care | Payer: PPO | Source: Ambulatory Visit

## 2022-06-15 DIAGNOSIS — Z1231 Encounter for screening mammogram for malignant neoplasm of breast: Secondary | ICD-10-CM | POA: Diagnosis not present

## 2022-08-31 DIAGNOSIS — G4730 Sleep apnea, unspecified: Secondary | ICD-10-CM | POA: Diagnosis not present

## 2022-10-05 ENCOUNTER — Other Ambulatory Visit (HOSPITAL_COMMUNITY): Payer: Self-pay | Admitting: Internal Medicine

## 2022-10-05 DIAGNOSIS — M8580 Other specified disorders of bone density and structure, unspecified site: Secondary | ICD-10-CM

## 2022-10-13 ENCOUNTER — Ambulatory Visit (HOSPITAL_COMMUNITY)
Admission: RE | Admit: 2022-10-13 | Discharge: 2022-10-13 | Disposition: A | Discharge status: Home / Self Care | Payer: PPO | Source: Ambulatory Visit | Attending: Internal Medicine | Admitting: Internal Medicine

## 2022-10-13 DIAGNOSIS — E559 Vitamin D deficiency, unspecified: Secondary | ICD-10-CM | POA: Insufficient documentation

## 2022-10-13 DIAGNOSIS — Z13820 Encounter for screening for osteoporosis: Secondary | ICD-10-CM | POA: Insufficient documentation

## 2022-10-13 DIAGNOSIS — Z780 Asymptomatic menopausal state: Secondary | ICD-10-CM | POA: Insufficient documentation

## 2022-10-13 DIAGNOSIS — M8580 Other specified disorders of bone density and structure, unspecified site: Secondary | ICD-10-CM | POA: Diagnosis present

## 2022-11-25 DIAGNOSIS — R7303 Prediabetes: Secondary | ICD-10-CM | POA: Diagnosis not present

## 2022-11-25 DIAGNOSIS — E559 Vitamin D deficiency, unspecified: Secondary | ICD-10-CM | POA: Diagnosis not present

## 2022-11-25 DIAGNOSIS — E782 Mixed hyperlipidemia: Secondary | ICD-10-CM | POA: Diagnosis not present

## 2022-11-25 DIAGNOSIS — E039 Hypothyroidism, unspecified: Secondary | ICD-10-CM | POA: Diagnosis not present

## 2022-12-01 DIAGNOSIS — Z96649 Presence of unspecified artificial hip joint: Secondary | ICD-10-CM | POA: Diagnosis not present

## 2022-12-01 DIAGNOSIS — Z23 Encounter for immunization: Secondary | ICD-10-CM | POA: Diagnosis not present

## 2022-12-01 DIAGNOSIS — I10 Essential (primary) hypertension: Secondary | ICD-10-CM | POA: Diagnosis not present

## 2022-12-01 DIAGNOSIS — R195 Other fecal abnormalities: Secondary | ICD-10-CM | POA: Diagnosis not present

## 2022-12-01 DIAGNOSIS — R944 Abnormal results of kidney function studies: Secondary | ICD-10-CM | POA: Diagnosis not present

## 2022-12-01 DIAGNOSIS — E782 Mixed hyperlipidemia: Secondary | ICD-10-CM | POA: Diagnosis not present

## 2022-12-01 DIAGNOSIS — R7303 Prediabetes: Secondary | ICD-10-CM | POA: Diagnosis not present

## 2022-12-01 DIAGNOSIS — E559 Vitamin D deficiency, unspecified: Secondary | ICD-10-CM | POA: Diagnosis not present

## 2022-12-01 DIAGNOSIS — G4700 Insomnia, unspecified: Secondary | ICD-10-CM | POA: Diagnosis not present

## 2022-12-01 DIAGNOSIS — E039 Hypothyroidism, unspecified: Secondary | ICD-10-CM | POA: Diagnosis not present

## 2022-12-01 DIAGNOSIS — M8580 Other specified disorders of bone density and structure, unspecified site: Secondary | ICD-10-CM | POA: Diagnosis not present

## 2022-12-01 DIAGNOSIS — F331 Major depressive disorder, recurrent, moderate: Secondary | ICD-10-CM | POA: Diagnosis not present

## 2023-01-13 DIAGNOSIS — G4733 Obstructive sleep apnea (adult) (pediatric): Secondary | ICD-10-CM | POA: Diagnosis not present

## 2023-02-13 DIAGNOSIS — G4733 Obstructive sleep apnea (adult) (pediatric): Secondary | ICD-10-CM | POA: Diagnosis not present

## 2023-03-13 DIAGNOSIS — G4733 Obstructive sleep apnea (adult) (pediatric): Secondary | ICD-10-CM | POA: Diagnosis not present

## 2023-05-26 DIAGNOSIS — E782 Mixed hyperlipidemia: Secondary | ICD-10-CM | POA: Diagnosis not present

## 2023-05-26 DIAGNOSIS — E039 Hypothyroidism, unspecified: Secondary | ICD-10-CM | POA: Diagnosis not present

## 2023-05-26 DIAGNOSIS — E559 Vitamin D deficiency, unspecified: Secondary | ICD-10-CM | POA: Diagnosis not present

## 2023-05-26 DIAGNOSIS — R7303 Prediabetes: Secondary | ICD-10-CM | POA: Diagnosis not present

## 2023-06-07 DIAGNOSIS — G4733 Obstructive sleep apnea (adult) (pediatric): Secondary | ICD-10-CM | POA: Diagnosis not present

## 2023-06-07 DIAGNOSIS — E039 Hypothyroidism, unspecified: Secondary | ICD-10-CM | POA: Diagnosis not present

## 2023-06-07 DIAGNOSIS — G4700 Insomnia, unspecified: Secondary | ICD-10-CM | POA: Diagnosis not present

## 2023-06-07 DIAGNOSIS — I129 Hypertensive chronic kidney disease with stage 1 through stage 4 chronic kidney disease, or unspecified chronic kidney disease: Secondary | ICD-10-CM | POA: Diagnosis not present

## 2023-06-07 DIAGNOSIS — R7303 Prediabetes: Secondary | ICD-10-CM | POA: Diagnosis not present

## 2023-06-07 DIAGNOSIS — R195 Other fecal abnormalities: Secondary | ICD-10-CM | POA: Diagnosis not present

## 2023-06-07 DIAGNOSIS — M8580 Other specified disorders of bone density and structure, unspecified site: Secondary | ICD-10-CM | POA: Diagnosis not present

## 2023-06-07 DIAGNOSIS — N189 Chronic kidney disease, unspecified: Secondary | ICD-10-CM | POA: Diagnosis not present

## 2023-06-07 DIAGNOSIS — N1831 Chronic kidney disease, stage 3a: Secondary | ICD-10-CM | POA: Diagnosis not present

## 2023-06-07 DIAGNOSIS — I10 Essential (primary) hypertension: Secondary | ICD-10-CM | POA: Diagnosis not present

## 2023-06-07 DIAGNOSIS — E782 Mixed hyperlipidemia: Secondary | ICD-10-CM | POA: Diagnosis not present

## 2023-06-07 DIAGNOSIS — E559 Vitamin D deficiency, unspecified: Secondary | ICD-10-CM | POA: Diagnosis not present

## 2023-06-08 ENCOUNTER — Other Ambulatory Visit: Payer: Self-pay | Admitting: Internal Medicine

## 2023-06-08 DIAGNOSIS — Z1231 Encounter for screening mammogram for malignant neoplasm of breast: Secondary | ICD-10-CM

## 2023-06-16 ENCOUNTER — Ambulatory Visit

## 2023-06-22 ENCOUNTER — Ambulatory Visit
Admission: RE | Admit: 2023-06-22 | Discharge: 2023-06-22 | Disposition: A | Discharge status: Home / Self Care | Source: Ambulatory Visit | Attending: Internal Medicine | Admitting: Internal Medicine

## 2023-06-22 DIAGNOSIS — Z1231 Encounter for screening mammogram for malignant neoplasm of breast: Secondary | ICD-10-CM | POA: Diagnosis not present

## 2023-07-19 DIAGNOSIS — Z7182 Exercise counseling: Secondary | ICD-10-CM | POA: Diagnosis not present

## 2023-07-19 DIAGNOSIS — G4733 Obstructive sleep apnea (adult) (pediatric): Secondary | ICD-10-CM | POA: Diagnosis not present

## 2023-07-19 DIAGNOSIS — E039 Hypothyroidism, unspecified: Secondary | ICD-10-CM | POA: Diagnosis not present

## 2023-07-19 DIAGNOSIS — F5105 Insomnia due to other mental disorder: Secondary | ICD-10-CM | POA: Diagnosis not present

## 2023-07-19 DIAGNOSIS — I10 Essential (primary) hypertension: Secondary | ICD-10-CM | POA: Diagnosis not present

## 2023-07-19 DIAGNOSIS — Z713 Dietary counseling and surveillance: Secondary | ICD-10-CM | POA: Diagnosis not present

## 2023-07-19 DIAGNOSIS — Z6838 Body mass index (BMI) 38.0-38.9, adult: Secondary | ICD-10-CM | POA: Diagnosis not present

## 2023-07-19 DIAGNOSIS — G4700 Insomnia, unspecified: Secondary | ICD-10-CM | POA: Diagnosis not present

## 2023-07-19 DIAGNOSIS — F331 Major depressive disorder, recurrent, moderate: Secondary | ICD-10-CM | POA: Diagnosis not present

## 2023-11-30 DIAGNOSIS — E782 Mixed hyperlipidemia: Secondary | ICD-10-CM | POA: Diagnosis not present

## 2023-11-30 DIAGNOSIS — E039 Hypothyroidism, unspecified: Secondary | ICD-10-CM | POA: Diagnosis not present

## 2023-11-30 DIAGNOSIS — E559 Vitamin D deficiency, unspecified: Secondary | ICD-10-CM | POA: Diagnosis not present

## 2023-11-30 DIAGNOSIS — R7303 Prediabetes: Secondary | ICD-10-CM | POA: Diagnosis not present

## 2023-12-08 DIAGNOSIS — R7303 Prediabetes: Secondary | ICD-10-CM | POA: Diagnosis not present

## 2023-12-08 DIAGNOSIS — F5105 Insomnia due to other mental disorder: Secondary | ICD-10-CM | POA: Diagnosis not present

## 2023-12-08 DIAGNOSIS — Z0000 Encounter for general adult medical examination without abnormal findings: Secondary | ICD-10-CM | POA: Diagnosis not present

## 2023-12-08 DIAGNOSIS — G4733 Obstructive sleep apnea (adult) (pediatric): Secondary | ICD-10-CM | POA: Diagnosis not present

## 2023-12-08 DIAGNOSIS — E039 Hypothyroidism, unspecified: Secondary | ICD-10-CM | POA: Diagnosis not present

## 2023-12-08 DIAGNOSIS — F331 Major depressive disorder, recurrent, moderate: Secondary | ICD-10-CM | POA: Diagnosis not present

## 2023-12-08 DIAGNOSIS — E782 Mixed hyperlipidemia: Secondary | ICD-10-CM | POA: Diagnosis not present

## 2023-12-08 DIAGNOSIS — Z23 Encounter for immunization: Secondary | ICD-10-CM | POA: Diagnosis not present

## 2023-12-08 DIAGNOSIS — M8580 Other specified disorders of bone density and structure, unspecified site: Secondary | ICD-10-CM | POA: Diagnosis not present

## 2023-12-08 DIAGNOSIS — N1831 Chronic kidney disease, stage 3a: Secondary | ICD-10-CM | POA: Diagnosis not present

## 2023-12-08 DIAGNOSIS — Z0001 Encounter for general adult medical examination with abnormal findings: Secondary | ICD-10-CM | POA: Diagnosis not present

## 2023-12-08 DIAGNOSIS — I10 Essential (primary) hypertension: Secondary | ICD-10-CM | POA: Diagnosis not present
